# Patient Record
Sex: Female | Born: 1950 | Race: White | Hispanic: No | Marital: Married | Smoking: Former smoker
Health system: Southern US, Community
[De-identification: ages and names within clinical notes are randomized; demographics above are authoritative.]

## PROBLEM LIST (undated history)

## (undated) DIAGNOSIS — M1990 Unspecified osteoarthritis, unspecified site: Secondary | ICD-10-CM

## (undated) DIAGNOSIS — R011 Cardiac murmur, unspecified: Secondary | ICD-10-CM

## (undated) DIAGNOSIS — C801 Malignant (primary) neoplasm, unspecified: Secondary | ICD-10-CM

## (undated) DIAGNOSIS — Z8619 Personal history of other infectious and parasitic diseases: Secondary | ICD-10-CM

## (undated) DIAGNOSIS — T8859XA Other complications of anesthesia, initial encounter: Secondary | ICD-10-CM

## (undated) DIAGNOSIS — S93699A Other sprain of unspecified foot, initial encounter: Secondary | ICD-10-CM

## (undated) DIAGNOSIS — T4145XA Adverse effect of unspecified anesthetic, initial encounter: Secondary | ICD-10-CM

## (undated) DIAGNOSIS — R351 Nocturia: Secondary | ICD-10-CM

## (undated) DIAGNOSIS — I10 Essential (primary) hypertension: Secondary | ICD-10-CM

## (undated) HISTORY — PX: HAMMER TOE SURGERY: SHX385

## (undated) HISTORY — PX: OTHER SURGICAL HISTORY: SHX169

## (undated) HISTORY — PX: TUBAL LIGATION: SHX77

## (undated) HISTORY — PX: TIBIA FRACTURE SURGERY: SHX806

## (undated) HISTORY — PX: TONSILLECTOMY: SUR1361

## (undated) HISTORY — PX: KNEE ARTHROSCOPY: SHX127

---

## 2003-10-15 HISTORY — PX: JOINT REPLACEMENT: SHX530

## 2007-07-28 ENCOUNTER — Inpatient Hospital Stay (HOSPITAL_COMMUNITY): Admission: RE | Admit: 2007-07-28 | Discharge: 2007-07-31 | Payer: Self-pay | Admitting: Orthopedic Surgery

## 2007-08-31 ENCOUNTER — Encounter: Admission: RE | Admit: 2007-08-31 | Discharge: 2007-09-23 | Payer: Self-pay | Admitting: Orthopedic Surgery

## 2008-04-07 ENCOUNTER — Ambulatory Visit (HOSPITAL_COMMUNITY): Admission: RE | Admit: 2008-04-07 | Discharge: 2008-04-07 | Payer: Self-pay | Admitting: Family Medicine

## 2009-01-18 IMAGING — CR DG CHEST 2V
2 series · 2 of 2 positions shown · non-contrast
Comparison: None

CLINICAL DATA: Preop for left knee injury
CHEST - 2 VIEW

[w chest pa]
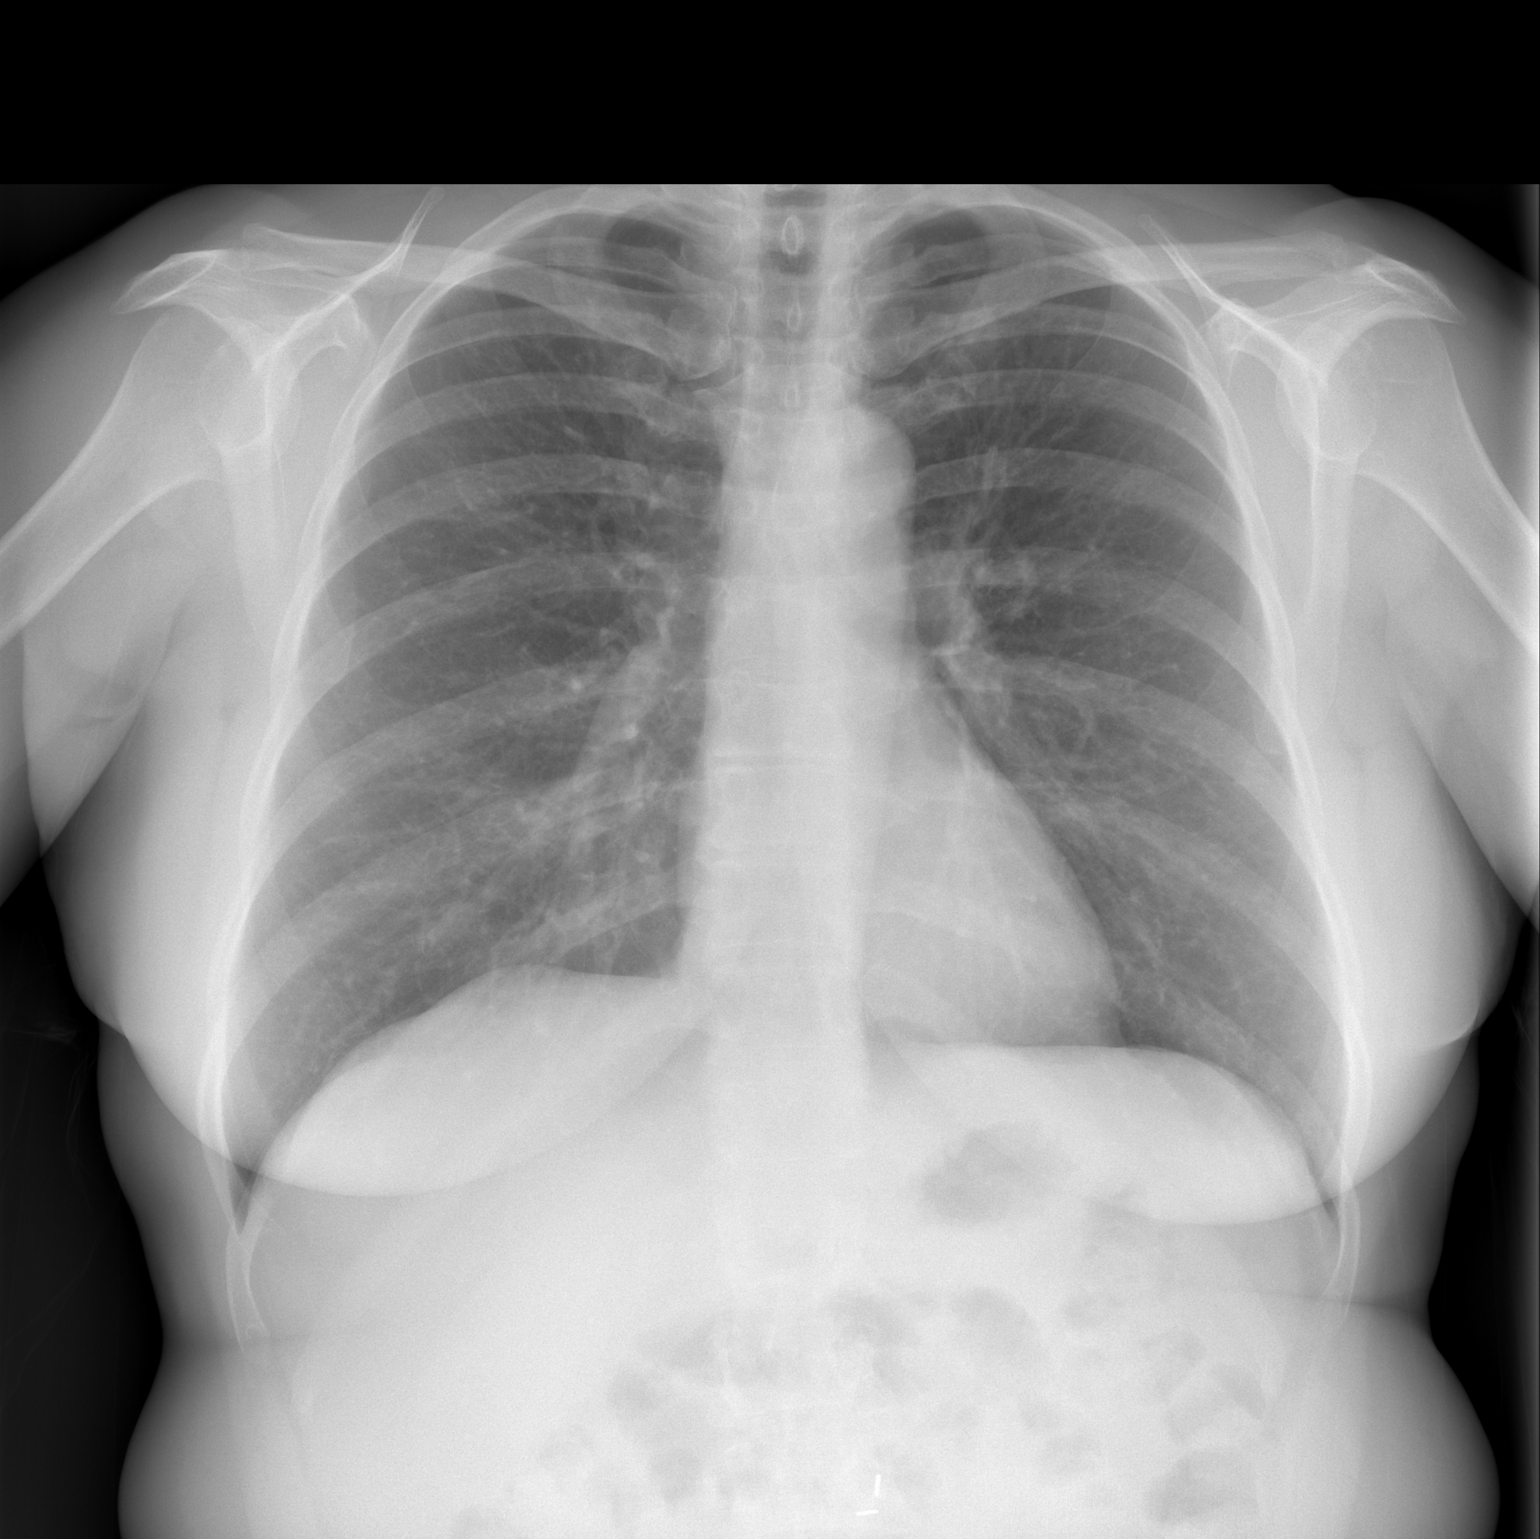

[w chest lat]
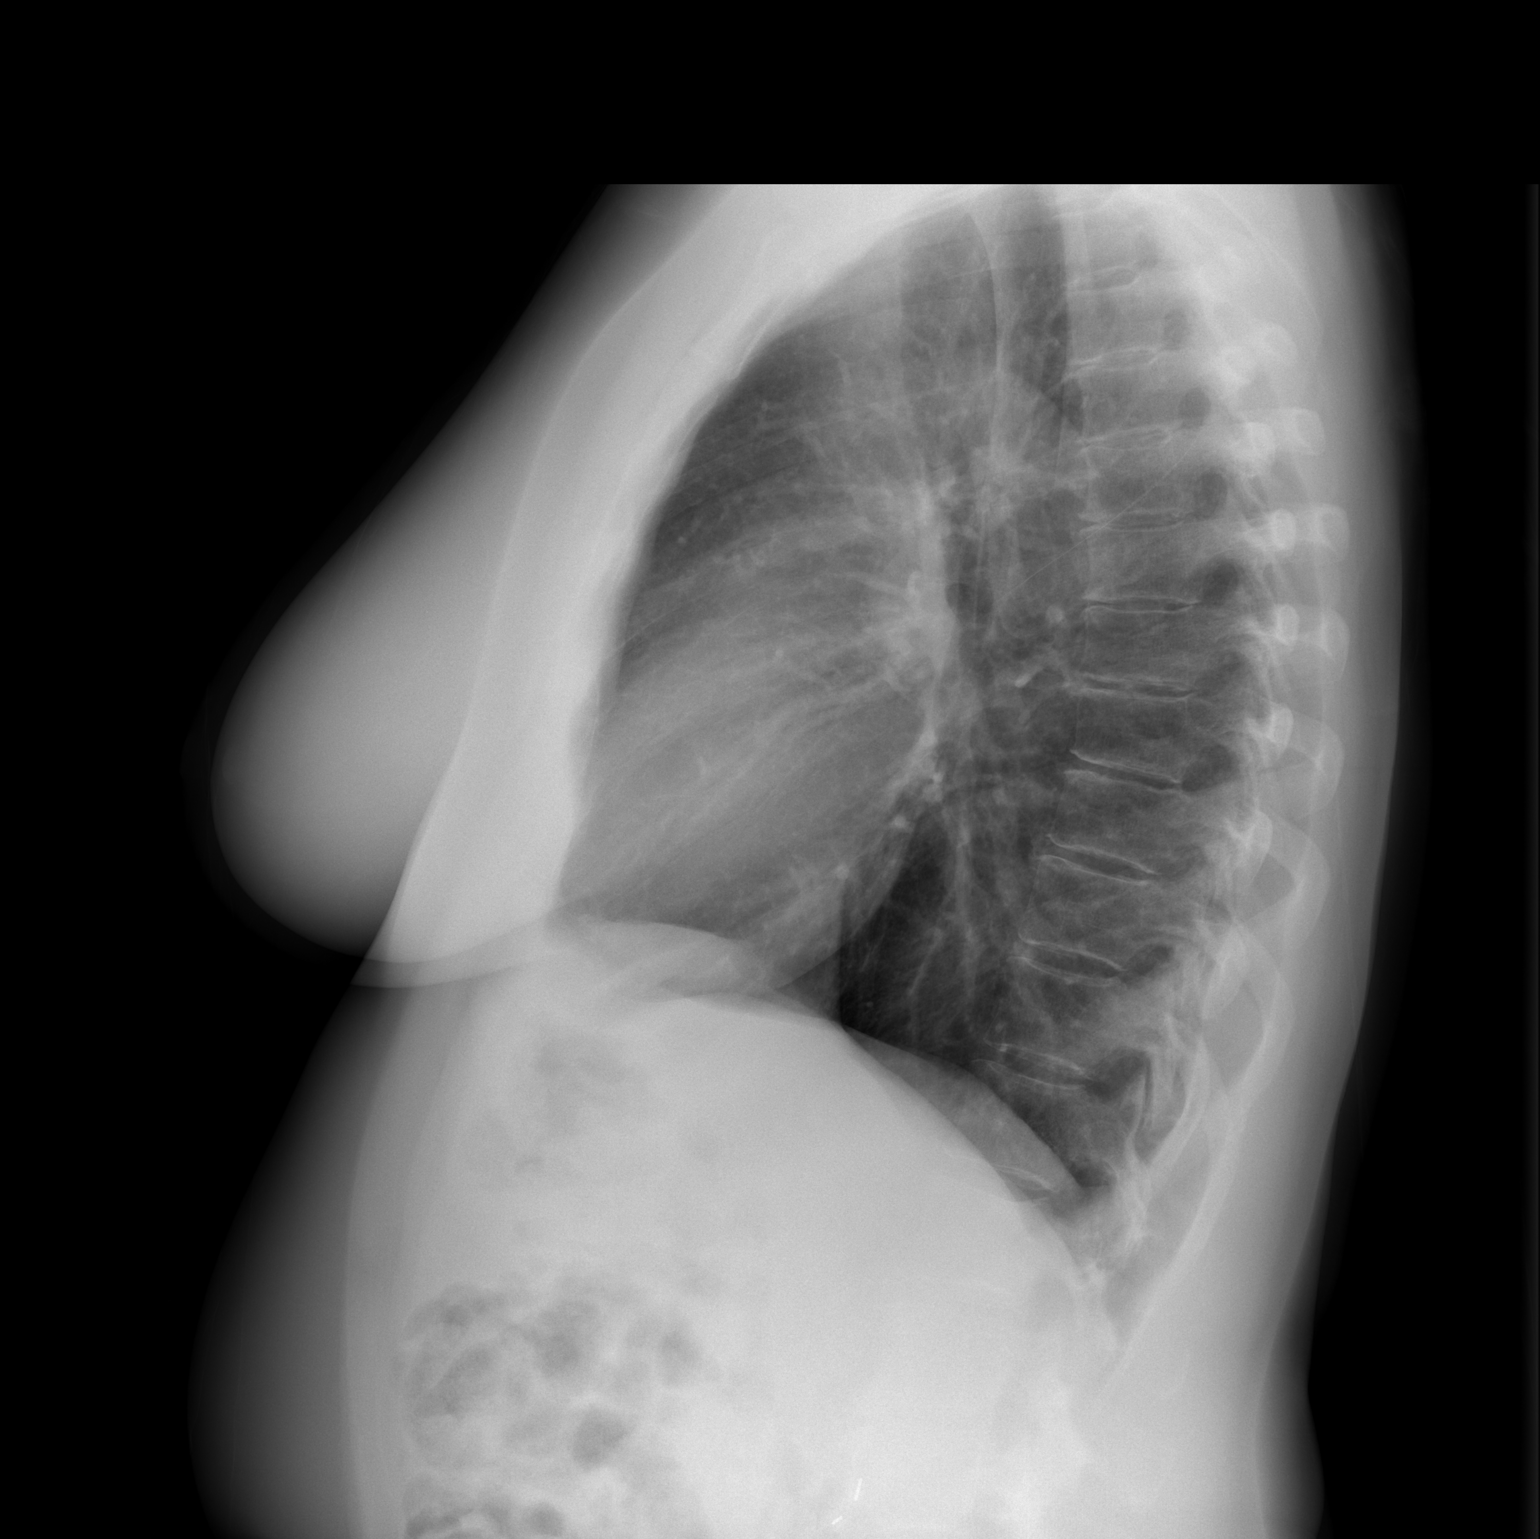

[2 of 2 positions shown; findings below may reference images not displayed]

FINDINGS: Midline trachea. Normal heart size and mediastinal contours. Sharp
costophrenic angles. No pneumothorax or congestive failure. Clear lungs. Mild
peribronchial thickening could relate to smoking or chronic bronchitis. Surgical
changes in the central abdomen.

IMPRESSION

No acute cardiopulmonary disease.

## 2009-04-10 ENCOUNTER — Ambulatory Visit (HOSPITAL_COMMUNITY): Admission: RE | Admit: 2009-04-10 | Discharge: 2009-04-10 | Payer: Self-pay | Admitting: Family Medicine

## 2011-02-26 NOTE — Discharge Summary (Signed)
[name], Reese NO.:  [id number]   MEDICAL RECORD NO.:  [id number]          PATIENT TYPE:  INP   LOCATION:  1612                         FACILITY:  [facility]   PHYSICIAN:  Madlyn Frankel. Charlann Boxer, M.D.  DATE OF BIRTH:  06-Jun-1951   DATE OF ADMISSION:  07/28/2007  DATE OF DISCHARGE:  07/31/2007                               DISCHARGE SUMMARY   ADMISSION DIAGNOSES:  1. Osteoarthritis.  2. Osteopenia.  3. History of de Quervain.  4. Trigger thumb.   DISCHARGE DIAGNOSES:  1. Osteoarthritis.  2. Osteopenia.  3. History of de Quervain.  4. Trigger thumb.   CONSULTATION:  None.   PROCEDURE:  Left total knee arthroplasty.  Surgeon:  Dr. Durene Romans.  Assistant:  Dwyane Luo.   HISTORY OF PRESENT ILLNESS:  A 60 year old female with a history of  tibial plateau fracture with post-traumatic osteoarthritis.  She was  refractory to all conservative treatment.  Was presurgical assessed by  her primary care physician, Dr. Joycelyn Rua, with evaluation by  Sherre Lain, FNP.   LABORATORY DATA:  Preadmission CBC:  Hematocrit 42.4; discharge  hematocrit stable at 29.5.  Routine chemistry within normal limits. At  discharge, all within normal limits.  Coags normal.  UA negative.   RADIOLOGY:  Chest two-view negative.   EKG:  Normal sinus rhythm.   HOSPITAL COURSE:  The patient underwent left total knee replacement and  tolerated procedure well. Admitted to orthopedic the floor.  She  remained afebrile throughout course of stay.  She remained  neurovascularly intact in left lower extremity throughout course of  stay.  She was able to do straight leg raise. On the time of discharge,  she was weightbearing as tolerated.  Dressing was changed on daily basis  postop days 2-4 with no significant drainage from wound.  Lovenox for  DVT prophylaxis started postop day #1 at 30 mg subcutaneously q. 12 h.  After 3 days she was stable, was able to walk at least 50 feet and was  ready for discharge with home health care and health aide worker due to  problematic a caregiver after surgery.   DISCHARGE DISPOSITION:  Stable and improved condition.   DISCHARGE PHYSICAL THERAPY:  She was weightbearing as tolerated with use  of rolling walker.   DISCHARGE DIET:  Regular.   DISCHARGE WOUND CARE:  Keep wound dry.   DISCHARGE MEDICATIONS:  1. Lovenox 30 mg subcutaneously q. 12 h x11 days.  2. Robaxin 500 mg p.o. q. 6 h.  3. Iron 325 mg p.o. 3 times a day x3 weeks.  4. Enteric-coated aspirin 325 mg p.o. daily x4 weeks after Lovenox      completed.  5. Colace 100 mg p.o. b.i.d.  6. MiraLax 17 grams p.o. daily.  7. Vicodin 5/325 one to two p.o. q. 4-6 h p.r.n. pain.  8. Actonel one weekly  9. Multivitamin.  10.Vitamin E.  11.Vitamin B.  12.Vitamin C 500 mg p.o. daily.  13.Fish oil 1000 mg x2 daily, hold for 1 week.  14.Calcium plus D daily.  15.Glucosamine chondroitin, hold.  16.Celebrex, hold.  DISCHARGE FOLLOWUP:  Follow up with Dr. Charlann Boxer in 2 weeks for wound care  check.     ______________________________  Yetta Glassman. [address], [place]      Madlyn Frankel. Charlann Boxer, M.D.  Electronically Signed    BLM/MEDQ  D:  08/18/2007  T:  08/18/2007  Job:  [id number]   cc:   Joycelyn Rua, M.D.  Fax: [phone]   Sherre Lain, NP

## 2011-02-26 NOTE — H&P (Signed)
[name], [name] NO.:  [id number]   MEDICAL RECORD NO.:  [id number]          PATIENT TYPE:  INP   LOCATION:  NA                           [facility]ACILITY:  [facility]   PHYSICIAN:  Madlyn Frankel. Charlann Boxer, M.D.  DATE OF BIRTH:  10/27/1950   DATE OF ADMISSION:  07/28/2007  DATE OF DISCHARGE:                              HISTORY & PHYSICAL   PROCEDURE:  Left total knee arthroplasty.   CHIEF COMPLAINT:  Left knee pain.   HISTORY OF PRESENT ILLNESS:  This is a 60 year old female with a history  of a tibial plateau fracture with post-traumatic osteoarthritis.  She  has been refractory to all conservative treatment.  She has been  presurgically assessed by her primary care physician, Dr. Joycelyn Rua, with evaluation by Carolynn Sayers, FNP.   PAST MEDICAL HISTORY:  Significant for:  1. Tibial plateau fracture with plate fixation and post-traumatic      osteoarthritis.  2. Osteopenia.  3. History of de Quervain.  4. Trigger thumb.   PAST SURGICAL HISTORY:  Includes:  1. Open reduction internal fixation tibial plateau fracture.  2. Bilateral carpal tunnel repair in the 1990s.   FAMILY HISTORY:  Heart disease, diabetes, cancer, stroke, arthritis,  lupus.   SOCIAL HISTORY:  The patient is married.  Primary caregiver after  surgery will be husband and friends.   DRUG ALLERGIES:  CODEINE upsets her stomach.   MEDICATIONS:  1. Darvocet-N 100 two tablets at bedtime p.r.n.  2. Multivitamin.  3. Vitamin E 500 units.  4. Vitamin B12 1000 mcg.  5. Vitamin B6 100 mg.  6. Vitamin C 500 mg.  7. Calcium plus D 600 mg.  8. Fish oil 1000 mg two daily, one a.m., one p.m.  9. Glucosamine chondroitin two daily, one a.m., one p.m.  10.Tylenol 500 mg two tablets p.r.n.   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  Otherwise, see HPI.   PHYSICAL EXAMINATION:  Pulse 72, respirations 18, blood pressure 140/88.  GENERAL:  Awake, alert and  oriented, well-developed, well-nourished, no  acute distress.  NECK:  Supple.  No carotid bruits.  CHEST:  Lungs clear to auscultation bilaterally.  BREASTS:  Deferred.  HEART:  Regular rate and rhythm without gallops, clicks, rubs or  murmurs.  ABDOMEN:  Soft, nontender, nondistended.  Bowel sounds present.  GENITOURINARY:  Deferred.  EXTREMITIES:  She has full extension.  She is mildly hyper with full  flexion 120 degrees plus  SKIN:  She does have an anterolateral scar from previous surgery.  No  signs of cellulitis.  No infection.  NEUROLOGIC:  Intact distal sensibilities.   LABORATORIES:  CBC are all within normal limits.  CMET within normal  limits.  EKG:  Normal sinus rhythm.  Chest x-ray pending.   IMPRESSION:  1. Post-traumatic osteoarthritis.  2. Osteopenia.   PLAN OF ACTION:  Left total knee arthroplasty with hardware removal by  surgeon Dr. Durene Romans.  Risks and complications were discussed.   Postoperative medications including Lovenox, Robaxin, iron, aspirin,  Colace, MiraLax provided  at time of history and physical.  Postoperative  pain medication will be given at time of surgery.     ______________________________  [name], [place]      Madlyn Frankel. Charlann Boxer, M.D.  Electronically Signed    BLM/MEDQ  D:  07/23/2007  T:  07/23/2007  Job:  [id number]

## 2011-02-26 NOTE — Op Note (Signed)
NAMEMARCI, [name] NO.:  [id number]   MEDICAL RECORD NO.:  [id number]          PATIENT TYPE:  INP   LOCATION:  0003                         FACILITY:  [facility]   PHYSICIAN:  Madlyn Frankel. Charlann Boxer, M.D.  DATE OF BIRTH:  [birth year]   DATE OF PROCEDURE:  07/28/2007  DATE OF DISCHARGE:                               OPERATIVE REPORT   PREOPERATIVE DIAGNOSIS:  Post traumatic left knee osteoarthritis  primarily in lateral compartment of the knee following open reduction  and internal fixation of left proximal tibia fracture.   POSTOPERATIVE DIAGNOSIS:  Post traumatic left knee osteoarthritis  primarily in lateral compartment of the knee following open reduction  and internal fixation of left proximal tibia fracture.   PROCEDURE:  1. Removal of left tibial hardware about the left knee.  2. Left total knee replacement.   COMPONENTS USED:  DePuy rotating platform posterior stabilized knee  system, size 3 femur, size 3 MBT revision tibial tray, given the  potential stress risers and the potential need for augmentation, insert  was size 10 and a 38 patellar button.   SURGEON:  Madlyn Frankel. Charlann Boxer, M.D.   ASSISTANT:  Yetta Glassman. Mann, P.A.-C.   ANESTHESIA:  Duramorph spinal.   DRAINS:  One.   TOURNIQUET TIME:  58 minutes at 350 mmHg.   COMPLICATIONS:  None.   INDICATIONS FOR PROCEDURE:  Ms. Jansma is a 60 year old female who  presented to the office for evaluation of her left knee.  She had had  persistent recurrent discomfort after open reduction and internal  fixation of a left tibial plateau fracture approximately two years  prior.  She had failed conservative measures of injections and, other  than some hesitation for a hospital stay and concerns she had from  previous hospitalization, she was definitely ready to have knee  replacement surgery due to discomfort and decreased quality of life.  She was unable to function at her normal level and wished to proceed.  The  risks of infection, DVT, component failure, need for revision,  extensive therapy required postop, were all discussed for the benefit of  pain relief.  Consent was obtained.   PROCEDURE IN DETAIL:  The patient was brought to the operative theater.  Once adequate anesthesia and preoperative antibiotics, Ancef, were  administered, the patient was positioned supine.  A proximal thigh  tourniquet was placed.  The left lower extremity was pre-scrubbed and  prepped and draped in a sterile fashion.  The leg was exsanguinated and  tourniquet elevated.  The patient had a more mid to lateral type  incision from her previous open reduction and internal fixation.  I  utilized this incision but extended it proximal to the patella to allow  for exposure of the knee.  The incision was made and skin flaps created.  The anterior lateral fascial compartment was opened with the Bovie  cautery and plate identified immediately.  I debrided the soft tissue,  muscle, and scar off of the plate.  I then removed seven 3.5 screws.  These screws were removed without difficulty, thus removing the plate  without issue.  It was taken off the field.  At this point, I irrigated  this wound and reapproximated the fascial wound with a #1 Vicryl.   Following this procedure, attention was now directed to the total knee  portion.  A median parapatellar arthrotomy was carried out with medial  patellar subluxation rather than eversion.  Initial debridement was  carried out.  Attention was first directed to the patella, the precut  measurement was 24 mm.  I resected down to 14 mm and used a 38 patellar  button.  Drill holes were made and the metal shim utilized to protect  the patella from retractors. Attention was now directed to the femur.  The femoral canal was opened with a drill and irrigated to prevent fat  emboli.  The intramedullary rod was then passed.  At 5 degrees of  valgus, I chose to remove 9 mm of bone off the  distal femur due to fact  that she had 10 degrees of hyperextension passively.  Following this  cut, I sized the femur to be a size 3.  The anterior, posterior, and  chamfer cuts were then made based on the posterior condylar reference  which was perpendicular to the [facility] line and very well visualized  epicondylar axis it was parallel to.  The final box cut was made based  off the lateral aspect of the distal femur.   At this point, attention was directed to the tibia where retractors were  placed.  The tibia was subluxed anteriorly and medial and lateral  meniscus removed.  The proximal tibial cut was based off the medial  side. I chose to resect 10 mm of bone off this side which allowed an  approximately 2 mm cut through the severely abnormal lateral proximal  tibia.  There was no normal cartilage there and it was severely eroded  away.  This cut was made and actually cut just beneath that lateral  defect making a fresh bone field.  There was noted to be a large cyst on  the bone and the central portion which was later reamed out preparing  for the DePuy revision tray.  At this point, I sized the cut surface to  be a size 3, pinned it in position, drilled it at 2 degrees anterior  slope.   Trial reduction was again carried out with the size 3 DePuy revision  tray, it was keeled in positioned along the line, checked and confirmed  the cut was perpendicular in the anterior plane and sloped enough  anteriorly for this component.  With the size 3 femoral component and  size 10 insert, the knee came out to full extension and was stable from  extension to flexion.  The patella tracked well without application of  pressure.  At this point, all trial components were removed.  I  irrigated the knee out with normal saline solution and prepared the bony  surfaces.  I injected the knee with 0.25% Marcaine with epinephrine and  1 mL of Toradol.  At this point, the final components were opened  and  then cemented into position.  Extruded and excess cement was removed.  Once the cement was removed and cured, excessive cement was debrided  from the posterior aspect of the knee.  When I was satisfied there was  no extra cement around, I irrigated the knee out and injected 5 mL of  FloSeal into the gutters medially and laterally.  I then placed the 10  mm insert.  A medium Hemovac drain was placed. The knee was brought up  to flexion with the tourniquet down and #1 Vicryl was used to  reapproximate the extensor mechanism.   The remainder of the wound was closed in layers with 2-0 Vicryl and  staples on the skin.  The knee was cleaned, dried and dressed sterilely  with Xeroform sterile dressing and a bulky wrap.  She was brought to the  recovery room in stable condition.      Madlyn Frankel Charlann Boxer, M.D.  Electronically Signed     MDO/MEDQ  D:  07/28/2007  T:  07/28/2007  Job:  [id number]

## 2011-07-24 LAB — CBC
HCT: 29.4 — ABNORMAL LOW
HCT: 29.5 — ABNORMAL LOW
Hemoglobin: 10.1 — ABNORMAL LOW
RBC: 3.27 — ABNORMAL LOW
RDW: 12.9
RDW: 13.9
WBC: 4.7

## 2011-07-24 LAB — BASIC METABOLIC PANEL
BUN: 7
CO2: 29
Calcium: 8.3 — ABNORMAL LOW
Chloride: 101
Creatinine, Ser: 0.76
GFR calc Af Amer: >60
Glucose, Bld: 95
Potassium: 4.1
Sodium: 134 — ABNORMAL LOW

## 2011-07-25 LAB — APTT: aPTT: 28

## 2011-07-25 LAB — URINALYSIS, ROUTINE W REFLEX MICROSCOPIC
Bilirubin Urine: NEGATIVE
Hgb urine dipstick: NEGATIVE
Ketones, ur: NEGATIVE
Nitrite: NEGATIVE
Protein, ur: NEGATIVE
Specific Gravity, Urine: 1.005

## 2011-07-25 LAB — COMPREHENSIVE METABOLIC PANEL
ALT: 62 — ABNORMAL HIGH
CO2: 30
Chloride: 100
Potassium: 4.3
Sodium: 138
Total Bilirubin: 0.6

## 2011-07-25 LAB — CBC
Hemoglobin: 14.3
MCHC: 35.3
Platelets: 305

## 2011-07-25 LAB — PROTIME-INR
INR: 1
Prothrombin Time: 13

## 2011-08-23 ENCOUNTER — Other Ambulatory Visit (HOSPITAL_BASED_OUTPATIENT_CLINIC_OR_DEPARTMENT_OTHER): Payer: Self-pay | Admitting: Family Medicine

## 2011-08-23 ENCOUNTER — Other Ambulatory Visit (HOSPITAL_COMMUNITY): Payer: Self-pay | Admitting: Family Medicine

## 2011-08-23 DIAGNOSIS — Z1231 Encounter for screening mammogram for malignant neoplasm of breast: Secondary | ICD-10-CM

## 2011-08-23 DIAGNOSIS — Z139 Encounter for screening, unspecified: Secondary | ICD-10-CM

## 2011-09-27 ENCOUNTER — Ambulatory Visit: Payer: Self-pay

## 2011-09-27 ENCOUNTER — Ambulatory Visit (HOSPITAL_COMMUNITY)
Admission: RE | Admit: 2011-09-27 | Discharge: 2011-09-27 | Disposition: A | Discharge status: Home / Self Care | Payer: 59 | Source: Ambulatory Visit | Attending: Family Medicine | Admitting: Family Medicine

## 2011-09-27 DIAGNOSIS — Z1231 Encounter for screening mammogram for malignant neoplasm of breast: Secondary | ICD-10-CM | POA: Insufficient documentation

## 2012-03-12 ENCOUNTER — Encounter (HOSPITAL_COMMUNITY): Payer: Self-pay | Admitting: Pharmacy Technician

## 2012-03-16 ENCOUNTER — Encounter (HOSPITAL_COMMUNITY): Payer: Self-pay

## 2012-03-16 ENCOUNTER — Encounter (HOSPITAL_COMMUNITY)
Admission: RE | Admit: 2012-03-16 | Discharge: 2012-03-16 | Disposition: A | Discharge status: Home / Self Care | Payer: 59 | Source: Ambulatory Visit | Attending: Orthopedic Surgery | Admitting: Orthopedic Surgery

## 2012-03-16 HISTORY — DX: Personal history of other infectious and parasitic diseases: Z86.19

## 2012-03-16 HISTORY — DX: Unspecified osteoarthritis, unspecified site: M19.90

## 2012-03-16 HISTORY — DX: Malignant (primary) neoplasm, unspecified: C80.1

## 2012-03-16 HISTORY — DX: Nocturia: R35.1

## 2012-03-16 HISTORY — DX: Other complications of anesthesia, initial encounter: T88.59XA

## 2012-03-16 HISTORY — DX: Other sprain of unspecified foot, initial encounter: S93.699A

## 2012-03-16 HISTORY — DX: Adverse effect of unspecified anesthetic, initial encounter: T41.45XA

## 2012-03-16 LAB — URINALYSIS, ROUTINE W REFLEX MICROSCOPIC
Bilirubin Urine: NEGATIVE
Glucose, UA: NEGATIVE mg/dL
Hgb urine dipstick: NEGATIVE
Ketones, ur: NEGATIVE mg/dL
Specific Gravity, Urine: 1.012 (ref 1.005–1.030)
pH: 6.5 (ref 5.0–8.0)

## 2012-03-16 LAB — CBC
Hemoglobin: 14.1 g/dL (ref 12.0–15.0)
MCH: 31.1 pg (ref 26.0–34.0)
MCHC: 34.6 g/dL (ref 30.0–36.0)
MCV: 90.1 fL (ref 78.0–100.0)
RBC: 4.53 MIL/uL (ref 3.87–5.11)

## 2012-03-16 LAB — BASIC METABOLIC PANEL
BUN: 16 mg/dL (ref 6–23)
CO2: 27 mEq/L (ref 19–32)
Calcium: 9.6 mg/dL (ref 8.4–10.5)
Chloride: 99 mEq/L (ref 96–112)
Creatinine, Ser: 0.68 mg/dL (ref 0.50–1.10)
GFR calc Af Amer: >90 mL/min (ref >90)
GFR calc non Af Amer: >90 mL/min (ref >90)
Glucose, Bld: 94 mg/dL (ref 70–99)
Potassium: 4.6 mEq/L (ref 3.5–5.1)
Sodium: 135 mEq/L (ref 135–145)

## 2012-03-16 LAB — SURGICAL PCR SCREEN
MRSA, PCR: NEGATIVE
Staphylococcus aureus: NEGATIVE

## 2012-03-16 LAB — DIFFERENTIAL
Basophils Relative: 0 % (ref 0–1)
Eosinophils Absolute: 0.1 10*3/uL (ref 0.0–0.7)
Eosinophils Relative: 1 % (ref 0–5)
Lymphs Abs: 2 10*3/uL (ref 0.7–4.0)
Monocytes Relative: 7 % (ref 3–12)

## 2012-03-16 LAB — APTT: aPTT: 32 seconds (ref 24–37)

## 2012-03-16 MED ORDER — CHLORHEXIDINE GLUCONATE 4 % EX LIQD
60.0000 mL | Freq: Once | CUTANEOUS | Status: DC
  Filled 2012-03-16: qty 60

## 2012-03-16 NOTE — Patient Instructions (Signed)
20 [name]  03/16/2012   Your procedure is scheduled on:  03/23/12  Report to SHORT STAY DEPT  at 5:15 AM.  Call this number if you have problems the morning of surgery: [phone]   Remember:   Do not eat food or drink liquids AFTER MIDNIGHT    Take these medicines the morning of surgery with A SIP OF WATER: NONE   Do not wear jewelry, make-up or nail polish.  Do not wear lotions, powders, or perfumes.   Do not shave legs or underarms 48 hrs. before surgery (men may shave face)  Do not bring valuables to the hospital.  Contacts, dentures or bridgework may not be worn into surgery.  Leave suitcase in the car. After surgery it may be brought to your room.  For patients admitted to the hospital, checkout time is 11:00 AM the day of discharge.   Patients discharged the day of surgery will not be allowed to drive home.    Special Instructions:   Please read over the following fact sheets that you were given: MRSA  Information / Incentive Spirometer               SHOWER WITH BETASEPT THE NIGHT BEFORE SURGERY AND THE MORNING OF SURGERY

## 2012-03-20 NOTE — H&P (Signed)
[name] is an 61 y.o. female.    Chief Complaint:  Left knee pain, S/P left total knee arthroplasty  HPI: Pt is a 61 y.o. female complaining of left knee pain for a couple of month.  Also having symptoms of clicking, popping, locking and giving out. Pain had continually increased since the beginning. X-rays in the clinic show the components of the left total knee arthroplasty to be in good position. Various options are discussed with the patient. Risks, benefits and expectations were discussed with the patient. Patient understand the risks, benefits and expectations and wishes to proceed with surgery.   PCP:  No primary provider on file.  D/C Plans:  Home with HHPT  Post-op Meds:   Rx given for ASA, Robaxin, Iron, Colace and MiraLax  Tranexamic Acid:   Not to be given  Decadron:   To be given  PMH: Past Medical History  Diagnosis Date  . Complication of anesthesia     ITCHING  . Arthritis   . History of shingles     X2  . Nocturia   . Cancer     Hx skin cancer  . Plantar fascia rupture     PSH: Past Surgical History  Procedure Date  . Joint replacement 2005    Lt  ttotal knee  . Carpal tunnell     BIL  . Thumb surg   . Trigger thumb    . Tibia fracture surgery     WITH PLATE  . Knee arthroscopy     LEFT  . Tonsillectomy   . Tubal ligation     Social History:  reports that she quit smoking about 7 years ago. She does not have any smokeless tobacco history on file. She reports that she drinks alcohol. She reports that she does not use illicit drugs.  Allergies:  Allergies  Allergen Reactions  . Codeine     Reaction=vertigo    Medications: No current facility-administered medications for this encounter.   Current Outpatient Prescriptions  Medication Sig Dispense Refill  . acetaminophen (TYLENOL) 500 MG tablet Take 1,000 mg by mouth every 6 (six) hours as needed. For pain      . amoxicillin (AMOXIL) 500 MG capsule Take 500 mg by mouth See admin  instructions. Take 4 capsules by mouth before dental procedures      . Calcium Carbonate-Vitamin D (CALCIUM + D PO) Take 1 tablet by mouth daily.      . fish oil-omega-3 fatty acids 1000 MG capsule Take 1,200 mg by mouth daily.      Marland Kitchen ibuprofen (ADVIL,MOTRIN) 200 MG tablet Take 400 mg by mouth every 6 (six) hours as needed. For pain      . Multiple Vitamin (MULITIVITAMIN WITH MINERALS) TABS Take 1 tablet by mouth daily.      . vitamin B-12 (CYANOCOBALAMIN) 100 MCG tablet Take 100 mcg by mouth daily.      . vitamin C (ASCORBIC ACID) 500 MG tablet Take 500 mg by mouth daily.      . vitamin E 400 UNIT capsule Take 400 Units by mouth daily.        ROS: Review of Systems  Constitutional: Negative.   HENT: Negative.   Eyes: Negative.   Respiratory: Negative.   Cardiovascular: Negative.   Gastrointestinal: Negative.   Genitourinary: Negative.   Musculoskeletal: Positive for joint pain.  Skin: Negative.   Neurological: Negative.   Endo/Heme/Allergies: Negative.   Psychiatric/Behavioral: Negative.      Physical Exam: BP: 143/94 ;  HR: 83 ; Resp: 18; Physical Exam  Constitutional: She is oriented to person, place, and time and well-developed, well-nourished, and in no distress.  HENT:  Head: Normocephalic and atraumatic.  Nose: Nose normal.  Mouth/Throat: Oropharynx is clear and moist.  Eyes: Pupils are equal, round, and reactive to light.  Neck: Neck supple. No JVD present. No tracheal deviation present. No thyromegaly present.  Cardiovascular: Normal rate, regular rhythm, normal heart sounds and intact distal pulses.   Pulmonary/Chest: Effort normal and breath sounds normal.  Abdominal: There is no tenderness. There is no guarding.  Musculoskeletal:       Left knee: She exhibits decreased range of motion, swelling and laceration (well healed from previous surgery). She exhibits no effusion, no ecchymosis and no deformity. tenderness found.  Lymphadenopathy:    She has no cervical  adenopathy.  Neurological: She is alert and oriented to person, place, and time.  Skin: Skin is warm and dry.  Psychiatric: Affect normal.      Assessment/Plan Assessment:  Left knee pain, S/P left total knee arthroplasty   Plan: Patient will undergo a left total knee revision with scar debridement and poly exchange on 03/23/2012 per Dr. Charlann Boxer at [facility]. Risks benefits and expectation were discussed with the patient. Patient understand risks, benefits and expectation and wishes to proceed.   Anastasio Auerbach Farryn Linares   PAC  03/20/2012, 12:51 PM

## 2012-03-23 ENCOUNTER — Encounter (HOSPITAL_COMMUNITY): Payer: Self-pay | Admitting: Anesthesiology

## 2012-03-23 ENCOUNTER — Observation Stay (HOSPITAL_COMMUNITY)
Admission: RE | Admit: 2012-03-23 | Discharge: 2012-03-25 | Disposition: A | Discharge status: Home / Self Care | Payer: 59 | Source: Ambulatory Visit | Attending: Orthopedic Surgery | Admitting: Orthopedic Surgery

## 2012-03-23 ENCOUNTER — Encounter (HOSPITAL_COMMUNITY): Payer: Self-pay | Admitting: *Deleted

## 2012-03-23 ENCOUNTER — Encounter (HOSPITAL_COMMUNITY)
Admission: RE | Disposition: A | Discharge status: Home / Self Care | Payer: Self-pay | Source: Ambulatory Visit | Attending: Orthopedic Surgery

## 2012-03-23 ENCOUNTER — Ambulatory Visit (HOSPITAL_COMMUNITY): Payer: 59 | Admitting: Anesthesiology

## 2012-03-23 DIAGNOSIS — T84029A Dislocation of unspecified internal joint prosthesis, initial encounter: Principal | ICD-10-CM | POA: Insufficient documentation

## 2012-03-23 DIAGNOSIS — Z96659 Presence of unspecified artificial knee joint: Secondary | ICD-10-CM

## 2012-03-23 DIAGNOSIS — Z01812 Encounter for preprocedural laboratory examination: Secondary | ICD-10-CM | POA: Insufficient documentation

## 2012-03-23 DIAGNOSIS — Y831 Surgical operation with implant of artificial internal device as the cause of abnormal reaction of the patient, or of later complication, without mention of misadventure at the time of the procedure: Secondary | ICD-10-CM | POA: Insufficient documentation

## 2012-03-23 DIAGNOSIS — M23302 Other meniscus derangements, unspecified lateral meniscus, unspecified knee: Secondary | ICD-10-CM | POA: Insufficient documentation

## 2012-03-23 DIAGNOSIS — R29898 Other symptoms and signs involving the musculoskeletal system: Secondary | ICD-10-CM | POA: Insufficient documentation

## 2012-03-23 DIAGNOSIS — Z79899 Other long term (current) drug therapy: Secondary | ICD-10-CM | POA: Insufficient documentation

## 2012-03-23 DIAGNOSIS — M25469 Effusion, unspecified knee: Secondary | ICD-10-CM | POA: Insufficient documentation

## 2012-03-23 DIAGNOSIS — Z85828 Personal history of other malignant neoplasm of skin: Secondary | ICD-10-CM | POA: Insufficient documentation

## 2012-03-23 HISTORY — PX: I&D KNEE WITH POLY EXCHANGE: SHX5024

## 2012-03-23 LAB — TYPE AND SCREEN: Antibody Screen: NEGATIVE

## 2012-03-23 SURGERY — IRRIGATION AND DEBRIDEMENT KNEE WITH POLY EXCHANGE
Anesthesia: Spinal | Site: Knee | Laterality: Left | Wound class: Clean

## 2012-03-23 MED ORDER — BUPIVACAINE IN DEXTROSE 0.75-8.25 % IT SOLN
INTRATHECAL | Status: DC | PRN
  Administered 2012-03-23: 1.8 mL via INTRATHECAL

## 2012-03-23 MED ORDER — ALUM & MAG HYDROXIDE-SIMETH 200-200-20 MG/5ML PO SUSP
30.0000 mL | ORAL | Status: DC | PRN
  Administered 2012-03-24 (×2): 30 mL via ORAL
  Filled 2012-03-23 (×2): qty 30

## 2012-03-23 MED ORDER — KETAMINE HCL 10 MG/ML IJ SOLN
INTRAMUSCULAR | Status: DC | PRN
  Administered 2012-03-23 (×10): 5 mg via INTRAVENOUS
  Administered 2012-03-23: 20 mg via INTRAVENOUS
  Administered 2012-03-23: 5 mg via INTRAVENOUS

## 2012-03-23 MED ORDER — BISACODYL 5 MG PO TBEC: 5.0000 mg | DELAYED_RELEASE_TABLET | Freq: Every day | ORAL | Status: DC | PRN

## 2012-03-23 MED ORDER — PROPOFOL 10 MG/ML IV EMUL
INTRAVENOUS | Status: DC | PRN
  Administered 2012-03-23: 200 ug/kg/min via INTRAVENOUS

## 2012-03-23 MED ORDER — ONDANSETRON HCL 4 MG PO TABS: 4.0000 mg | ORAL_TABLET | Freq: Four times a day (QID) | ORAL | Status: DC | PRN

## 2012-03-23 MED ORDER — ACETAMINOPHEN 10 MG/ML IV SOLN
INTRAVENOUS | Status: DC | PRN
  Administered 2012-03-23: 1000 mg via INTRAVENOUS

## 2012-03-23 MED ORDER — FLEET ENEMA 7-19 GM/118ML RE ENEM: 1.0000 | ENEMA | Freq: Once | RECTAL | Status: AC | PRN

## 2012-03-23 MED ORDER — HYDROMORPHONE HCL PF 1 MG/ML IJ SOLN: 0.2500 mg | INTRAMUSCULAR | Status: DC | PRN

## 2012-03-23 MED ORDER — ZOLPIDEM TARTRATE 5 MG PO TABS
5.0000 mg | ORAL_TABLET | Freq: Every evening | ORAL | Status: DC | PRN
  Administered 2012-03-23: 5 mg via ORAL
  Filled 2012-03-23: qty 1

## 2012-03-23 MED ORDER — PHENOL 1.4 % MT LIQD: 1.0000 | OROMUCOSAL | Status: DC | PRN

## 2012-03-23 MED ORDER — KETOROLAC TROMETHAMINE 30 MG/ML IJ SOLN
INTRAMUSCULAR | Status: DC | PRN
  Administered 2012-03-23: 30 mg

## 2012-03-23 MED ORDER — CEFAZOLIN SODIUM 1-5 GM-% IV SOLN
1.0000 g | Freq: Four times a day (QID) | INTRAVENOUS | Status: AC
  Administered 2012-03-23 (×2): 1 g via INTRAVENOUS
  Filled 2012-03-23 (×2): qty 50

## 2012-03-23 MED ORDER — CEFAZOLIN SODIUM 1-5 GM-% IV SOLN
1.0000 g | INTRAVENOUS | Status: AC
  Administered 2012-03-23: 2 g via INTRAVENOUS

## 2012-03-23 MED ORDER — SODIUM CHLORIDE 0.9 % IV SOLN
INTRAVENOUS | Status: DC
  Administered 2012-03-23 – 2012-03-24 (×2): via INTRAVENOUS
  Filled 2012-03-23 (×9): qty 1000

## 2012-03-23 MED ORDER — METOCLOPRAMIDE HCL 10 MG PO TABS: 5.0000 mg | ORAL_TABLET | Freq: Three times a day (TID) | ORAL | Status: DC | PRN

## 2012-03-23 MED ORDER — CEFAZOLIN SODIUM-DEXTROSE 2-3 GM-% IV SOLR
INTRAVENOUS | Status: AC
  Filled 2012-03-23: qty 50

## 2012-03-23 MED ORDER — HYDROCODONE-ACETAMINOPHEN 7.5-325 MG PO TABS
1.0000 | ORAL_TABLET | ORAL | Status: DC
  Administered 2012-03-23: 1 via ORAL
  Administered 2012-03-23 – 2012-03-24 (×3): 2 via ORAL
  Administered 2012-03-24: 1 via ORAL
  Filled 2012-03-23: qty 1
  Filled 2012-03-23 (×4): qty 2

## 2012-03-23 MED ORDER — ACETAMINOPHEN 10 MG/ML IV SOLN
INTRAVENOUS | Status: AC
  Filled 2012-03-23: qty 100

## 2012-03-23 MED ORDER — GLYCOPYRROLATE 0.2 MG/ML IJ SOLN
INTRAMUSCULAR | Status: DC | PRN
  Administered 2012-03-23 (×3): .05 mg via INTRAVENOUS

## 2012-03-23 MED ORDER — BUPIVACAINE-EPINEPHRINE 0.25% -1:200000 IJ SOLN
INTRAMUSCULAR | Status: AC
  Filled 2012-03-23: qty 1

## 2012-03-23 MED ORDER — FERROUS SULFATE 325 (65 FE) MG PO TABS
325.0000 mg | ORAL_TABLET | Freq: Three times a day (TID) | ORAL | Status: DC
  Administered 2012-03-23 – 2012-03-25 (×4): 325 mg via ORAL
  Filled 2012-03-23 (×10): qty 1

## 2012-03-23 MED ORDER — DOCUSATE SODIUM 100 MG PO CAPS
100.0000 mg | ORAL_CAPSULE | Freq: Two times a day (BID) | ORAL | Status: DC
  Administered 2012-03-23 – 2012-03-25 (×4): 100 mg via ORAL

## 2012-03-23 MED ORDER — PHENYLEPHRINE HCL 10 MG/ML IJ SOLN
10.0000 mg | INTRAVENOUS | Status: DC | PRN
  Administered 2012-03-23: 20 ug/min via INTRAVENOUS

## 2012-03-23 MED ORDER — TRANEXAMIC ACID 100 MG/ML IV SOLN
1180.0000 mg | Freq: Once | INTRAVENOUS | Status: DC
  Filled 2012-03-23: qty 11.8

## 2012-03-23 MED ORDER — METHOCARBAMOL 100 MG/ML IJ SOLN
500.0000 mg | Freq: Four times a day (QID) | INTRAVENOUS | Status: DC | PRN
  Administered 2012-03-23: 500 mg via INTRAVENOUS
  Filled 2012-03-23: qty 5

## 2012-03-23 MED ORDER — POLYETHYLENE GLYCOL 3350 17 G PO PACK
17.0000 g | PACK | Freq: Two times a day (BID) | ORAL | Status: DC
  Administered 2012-03-24 – 2012-03-25 (×3): 17 g via ORAL

## 2012-03-23 MED ORDER — MEPERIDINE HCL 50 MG/ML IJ SOLN: 6.2500 mg | INTRAMUSCULAR | Status: DC | PRN

## 2012-03-23 MED ORDER — LACTATED RINGERS IV SOLN
INTRAVENOUS | Status: DC | PRN
  Administered 2012-03-23 (×3): via INTRAVENOUS

## 2012-03-23 MED ORDER — LIDOCAINE HCL (CARDIAC) 20 MG/ML IV SOLN
INTRAVENOUS | Status: DC | PRN
  Administered 2012-03-23: 50 mg via INTRAVENOUS

## 2012-03-23 MED ORDER — PROMETHAZINE HCL 25 MG/ML IJ SOLN: 6.2500 mg | INTRAMUSCULAR | Status: DC | PRN

## 2012-03-23 MED ORDER — ONDANSETRON HCL 4 MG/2ML IJ SOLN
INTRAMUSCULAR | Status: DC | PRN
  Administered 2012-03-23: 4 mg via INTRAVENOUS

## 2012-03-23 MED ORDER — MIDAZOLAM HCL 5 MG/5ML IJ SOLN
INTRAMUSCULAR | Status: DC | PRN
  Administered 2012-03-23 (×4): 1 mg via INTRAVENOUS

## 2012-03-23 MED ORDER — MENTHOL 3 MG MT LOZG: 1.0000 | LOZENGE | OROMUCOSAL | Status: DC | PRN

## 2012-03-23 MED ORDER — HYDROMORPHONE HCL PF 1 MG/ML IJ SOLN
0.5000 mg | INTRAMUSCULAR | Status: DC | PRN
  Administered 2012-03-23: 1 mg via INTRAVENOUS
  Administered 2012-03-23: 0.5 mg via INTRAVENOUS
  Administered 2012-03-23 – 2012-03-24 (×2): 1 mg via INTRAVENOUS
  Administered 2012-03-24: 0.5 mg via INTRAVENOUS
  Administered 2012-03-24: 1 mg via INTRAVENOUS
  Filled 2012-03-23 (×6): qty 1

## 2012-03-23 MED ORDER — LACTATED RINGERS IV SOLN: INTRAVENOUS | Status: DC

## 2012-03-23 MED ORDER — METHOCARBAMOL 500 MG PO TABS
500.0000 mg | ORAL_TABLET | Freq: Four times a day (QID) | ORAL | Status: DC | PRN
  Administered 2012-03-23 – 2012-03-25 (×6): 500 mg via ORAL
  Filled 2012-03-23 (×6): qty 1

## 2012-03-23 MED ORDER — FENTANYL CITRATE 0.05 MG/ML IJ SOLN
INTRAMUSCULAR | Status: DC | PRN
  Administered 2012-03-23 (×2): 50 ug via INTRAVENOUS
  Administered 2012-03-23: 75 ug via INTRAVENOUS
  Administered 2012-03-23: 50 ug via INTRAVENOUS
  Administered 2012-03-23: 25 ug via INTRAVENOUS
  Administered 2012-03-23: 50 ug via INTRAVENOUS

## 2012-03-23 MED ORDER — ONDANSETRON HCL 4 MG/2ML IJ SOLN
4.0000 mg | Freq: Four times a day (QID) | INTRAMUSCULAR | Status: DC | PRN
  Administered 2012-03-24: 4 mg via INTRAVENOUS
  Filled 2012-03-23: qty 2

## 2012-03-23 MED ORDER — KETOROLAC TROMETHAMINE 30 MG/ML IJ SOLN
INTRAMUSCULAR | Status: AC
  Filled 2012-03-23: qty 1

## 2012-03-23 MED ORDER — METOCLOPRAMIDE HCL 5 MG/ML IJ SOLN: 5.0000 mg | Freq: Three times a day (TID) | INTRAMUSCULAR | Status: DC | PRN

## 2012-03-23 MED ORDER — PHENYLEPHRINE HCL 10 MG/ML IJ SOLN
INTRAMUSCULAR | Status: DC | PRN
  Administered 2012-03-23 (×2): 80 ug via INTRAVENOUS
  Administered 2012-03-23: 40 ug via INTRAVENOUS
  Administered 2012-03-23: 80 ug via INTRAVENOUS

## 2012-03-23 MED ORDER — DEXAMETHASONE SODIUM PHOSPHATE 10 MG/ML IJ SOLN
INTRAMUSCULAR | Status: DC | PRN
  Administered 2012-03-23: 10 mg via INTRAVENOUS

## 2012-03-23 MED ORDER — BUPIVACAINE-EPINEPHRINE PF 0.25-1:200000 % IJ SOLN
INTRAMUSCULAR | Status: DC | PRN
  Administered 2012-03-23: 50 mL

## 2012-03-23 MED ORDER — RIVAROXABAN 10 MG PO TABS
10.0000 mg | ORAL_TABLET | ORAL | Status: DC
  Administered 2012-03-24 – 2012-03-25 (×2): 10 mg via ORAL
  Filled 2012-03-23 (×3): qty 1

## 2012-03-23 MED ORDER — SODIUM CHLORIDE 0.9 % IR SOLN
Status: DC | PRN
  Administered 2012-03-23: 1000 mL

## 2012-03-23 MED ORDER — TRANEXAMIC ACID 100 MG/ML IV SOLN
1180.0000 mg | INTRAVENOUS | Status: DC | PRN
  Administered 2012-03-23: 1180 mg via INTRAVENOUS

## 2012-03-23 MED ORDER — DEXAMETHASONE SODIUM PHOSPHATE 10 MG/ML IJ SOLN
10.0000 mg | Freq: Once | INTRAMUSCULAR | Status: DC
  Filled 2012-03-23: qty 1

## 2012-03-23 MED ORDER — CELECOXIB 200 MG PO CAPS
200.0000 mg | ORAL_CAPSULE | Freq: Two times a day (BID) | ORAL | Status: DC
  Administered 2012-03-23 – 2012-03-25 (×5): 200 mg via ORAL
  Filled 2012-03-23 (×6): qty 1

## 2012-03-23 SURGICAL SUPPLY — 56 items
BAG ZIPLOCK 12X15 (MISCELLANEOUS) ×3 IMPLANT
BANDAGE ELASTIC 6 VELCRO ST LF (GAUZE/BANDAGES/DRESSINGS) ×3 IMPLANT
BANDAGE ESMARK 6X9 LF (GAUZE/BANDAGES/DRESSINGS) ×2 IMPLANT
BLADE SAW SGTL 13.0X1.19X90.0M (BLADE) ×3 IMPLANT
BLADE SAW SGTL 81X20 HD (BLADE) IMPLANT
BNDG ESMARK 6X9 LF (GAUZE/BANDAGES/DRESSINGS) ×3
BOWL SMART MIX CTS (DISPOSABLE) IMPLANT
CLOTH BEACON ORANGE TIMEOUT ST (SAFETY) ×3 IMPLANT
CUFF TOURN SGL QUICK 34 (TOURNIQUET CUFF) ×1
CUFF TRNQT CYL 34X4X40X1 (TOURNIQUET CUFF) ×2 IMPLANT
DRAPE EXTREMITY T 121X128X90 (DRAPE) ×3 IMPLANT
DRAPE POUCH INSTRU U-SHP 10X18 (DRAPES) ×3 IMPLANT
DRAPE U-SHAPE 47X51 STRL (DRAPES) ×3 IMPLANT
DRSG AQUACEL AG ADV 3.5X10 (GAUZE/BANDAGES/DRESSINGS) ×3 IMPLANT
DRSG PAD ABDOMINAL 8X10 ST (GAUZE/BANDAGES/DRESSINGS) ×3 IMPLANT
DURAPREP 26ML APPLICATOR (WOUND CARE) ×3 IMPLANT
ELECT REM PT RETURN 9FT ADLT (ELECTROSURGICAL) ×3
ELECTRODE REM PT RTRN 9FT ADLT (ELECTROSURGICAL) ×2 IMPLANT
EVACUATOR 1/8 PVC DRAIN (DRAIN) ×3 IMPLANT
FACESHIELD LNG OPTICON STERILE (SAFETY) ×15 IMPLANT
GAUZE XEROFORM 5X9 LF (GAUZE/BANDAGES/DRESSINGS) ×3 IMPLANT
GLOVE BIOGEL PI IND STRL 7.5 (GLOVE) ×2 IMPLANT
GLOVE BIOGEL PI IND STRL 8 (GLOVE) ×2 IMPLANT
GLOVE BIOGEL PI INDICATOR 7.5 (GLOVE) ×1
GLOVE BIOGEL PI INDICATOR 8 (GLOVE) ×1
GLOVE ORTHO TXT STRL SZ7.5 (GLOVE) ×6 IMPLANT
GOWN BRE IMP PREV XXLGXLNG (GOWN DISPOSABLE) ×3 IMPLANT
GOWN STRL NON-REIN LRG LVL3 (GOWN DISPOSABLE) ×3 IMPLANT
HANDPIECE INTERPULSE COAX TIP (DISPOSABLE) ×1
IMMOBILIZER KNEE 20 (SOFTGOODS) ×3
IMMOBILIZER KNEE 20 THIGH 36 (SOFTGOODS) ×2 IMPLANT
INSERT PFC SIG STB SZ3 15.0MM (Knees) ×3 IMPLANT
KIT BASIN OR (CUSTOM PROCEDURE TRAY) ×3 IMPLANT
MANIFOLD NEPTUNE II (INSTRUMENTS) ×3 IMPLANT
NDL SAFETY ECLIPSE 18X1.5 (NEEDLE) ×2 IMPLANT
NEEDLE HYPO 18GX1.5 SHARP (NEEDLE) ×1
NS IRRIG 1000ML POUR BTL (IV SOLUTION) ×3 IMPLANT
PACK TOTAL JOINT (CUSTOM PROCEDURE TRAY) ×3 IMPLANT
PADDING CAST COTTON 6X4 STRL (CAST SUPPLIES) ×3 IMPLANT
POSITIONER SURGICAL ARM (MISCELLANEOUS) ×3 IMPLANT
SET HNDPC FAN SPRY TIP SCT (DISPOSABLE) ×2 IMPLANT
SET PAD KNEE POSITIONER (MISCELLANEOUS) ×3 IMPLANT
SPONGE GAUZE 4X4 12PLY (GAUZE/BANDAGES/DRESSINGS) ×3 IMPLANT
SPONGE LAP 18X18 X RAY DECT (DISPOSABLE) IMPLANT
STAPLER VISISTAT (STAPLE) ×6 IMPLANT
SUCTION FRAZIER 12FR DISP (SUCTIONS) ×3 IMPLANT
SUT VIC AB 1 CT1 36 (SUTURE) ×3 IMPLANT
SUT VIC AB 2-0 CT1 27 (SUTURE) ×3
SUT VIC AB 2-0 CT1 TAPERPNT 27 (SUTURE) ×6 IMPLANT
SUT VLOC 180 0 24IN GS25 (SUTURE) ×3 IMPLANT
SYR 50ML LL SCALE MARK (SYRINGE) ×3 IMPLANT
TOWEL OR 17X26 10 PK STRL BLUE (TOWEL DISPOSABLE) ×6 IMPLANT
TOWER CARTRIDGE SMART MIX (DISPOSABLE) IMPLANT
TRAY FOLEY CATH 14FRSI W/METER (CATHETERS) ×3 IMPLANT
WATER STERILE IRR 1500ML POUR (IV SOLUTION) IMPLANT
WRAP KNEE MAXI GEL POST OP (GAUZE/BANDAGES/DRESSINGS) ×3 IMPLANT

## 2012-03-23 NOTE — Anesthesia Postprocedure Evaluation (Signed)
  Anesthesia Post-op Note  Patient: [name]  Procedure(s) Performed: Procedure(s) (LRB): IRRIGATION AND DEBRIDEMENT KNEE WITH POLY EXCHANGE (Left)  Patient Location: PACU  Anesthesia Type: Spinal  Level of Consciousness: awake and alert   Airway and Oxygen Therapy: Patient Spontanous Breathing  Post-op Pain: mild  Post-op Assessment: Post-op Vital signs reviewed, Patient's Cardiovascular Status Stable, Respiratory Function Stable, Patent Airway and No signs of Nausea or vomiting  Post-op Vital Signs: stable  Complications: No apparent anesthesia complications

## 2012-03-23 NOTE — Transfer of Care (Signed)
Immediate Anesthesia Transfer of Care Note  Patient: [name]  Procedure(s) Performed: Procedure(s) (LRB): IRRIGATION AND DEBRIDEMENT KNEE WITH POLY EXCHANGE (Left)  Patient Location: PACU  Anesthesia Type: MAC and Spinal  Level of Consciousness: awake, alert , oriented, patient cooperative and responds to stimulation  Airway & Oxygen Therapy: Patient Spontanous Breathing  Post-op Assessment: Report given to PACU RN and Post -op Vital signs reviewed and stable, able to move upper extremities, spinal at T11  Post vital signs: Reviewed and stable  Complications: No apparent anesthesia complications

## 2012-03-23 NOTE — Op Note (Signed)
Latoya, Reese NO.:  [id number]  MEDICAL RECORD NO.:  [id number]  LOCATION:  1619                         FACILITY:  [facility]  PHYSICIAN:  Madlyn Frankel. Charlann Boxer, M.D.  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  PROCEDURES: 1. Left knee excision, debridement of scar involving the medial     lateral superior and inferior patellar region. 2. Polyethylene exchange revision with changing from a size 3 x 10 mm     insert to a 3 x 15 mm insert.  SURGEON:  Madlyn Frankel. Charlann Boxer, M.D.  ASSISTANT:  Lanney Gins, PA  Please note that Mr. Carmon Sails was present for the entirety of the case, utilized for general facilitation of the case, retractor management and primary wound closure.  ANESTHESIA:  Spinal.  SPECIMENS:  None.  COMPLICATIONS:  None apparent.  TOURNIQUET TIME:  20 minutes at 250 mmHg.  BLOOD LOSS:  Minimal.  DRAINS:  One medium Hemovac.  INDICATIONS FOR PROCEDURE:  Ms. [name] is a 61 year old female with a history of left knee replacement in 2008.  She had done well, but developed some sense of instability with her knee, with recurrent swellings and crepitation.  Examination raised no concern for infection. Given the persistence of her problems, she wished to proceed with more definitive measures.  We discussed about scar debridement and polyethylene exchange.  She wished to proceed in this fashion.  Risks of recurrence of instability and recurrent scar in addition to standard risk of  infection, DVT, and future revision for her full components were all discussed, reviewed, consent was not obtained.  PROCEDURE IN DETAIL:  The patient was brought to the operative theater. Once adequate anesthesia, preop antibiotics, Ancef administered she was positioned supine and a left thigh tourniquet placed.  The left lower extremity was then prepped and draped in  sterile fashion with the foot placed in a DeMayo leg holder.  Time-out was performed identifying the patient, planned procedure, and extremity.  The leg was exsanguinated, tourniquet elevated to 250 mmHg.  Her old incision was utilized and actually this scar portion excised, the soft tissue planes were created. A median arthrotomy was then made encountering clear synovial fluid.  At this point, I debrided the medial synovial scar in the lateral suprapatellar and inferior patellar scar.  Following a proximal medial peel, the knee was subluxated anteriorly and the old polyethylene removed.  Trial components were opened and following debridement of the posterior aspect of the knee as well as confirming further adequate debridement along the lateral aspect of the joint, the trial components were placed.  The 15 mm insert was chosen based on a preoperative assessment of hyperextension and instability from extension to flexion.  With the 15 mm insert she came to full extension with stable ligaments  in extension and flexion.  Given all these findings the trial components were removed, we irrigated the knee with normal saline solution, pulse lavage approximately 1500 mL.  The final 3 x 10 mm insert was then placed into the knee.  The knee was re- irrigated.  Tourniquet was let down after 20 minutes.  A medium Hemovac drain was placed deep.  Extensor mechanism was then reapproximated with the knee in flexion.  The remainder of the wound was closed with 2-0 Vicryl, and staples on the skin.  The skin was cleaned, dried and dressed  sterilely using Xeroform, dressing sponges and bulky dressing. She was brought to the recovery room in stable condition, tolerating the procedure well.     Madlyn Frankel Charlann Boxer, M.D.     MDO/MEDQ  D:  03/23/2012  T:  03/23/2012  Job:  [id number]

## 2012-03-23 NOTE — Interval H&P Note (Signed)
History and Physical Interval Note:  03/23/2012 7:18 AM  [name]  has presented today for surgery, with the diagnosis of s/p left total knee with patella clunk,scar  The various methods of treatment have been discussed with the patient and family. After consideration of risks, benefits and other options for treatment, the patient has consented to  Procedure(s) (LRB):LEFT TOTAL KNEE REVISION WITH SCAR DEBRIDEMENT/PATELLA REVISION WITH POLY EXCHANGE (Left) as a surgical intervention .  The patients' history has been reviewed, patient examined, no change in status, stable for surgery.  I have reviewed the patients' chart and labs.  Questions were answered to the patient's satisfaction.     Shelda Pal

## 2012-03-23 NOTE — Preoperative (Signed)
Beta Blockers   Reason not to administer Beta Blockers:Not Applicable, not on home bb 

## 2012-03-23 NOTE — Anesthesia Procedure Notes (Signed)
Spinal Patient location during procedure: OR Staffing Anesthesiologist: Vernel Donlan Performed by: anesthesiologist  Preanesthetic Checklist Completed: patient identified, site marked, surgical consent, pre-op evaluation, timeout performed, IV checked, risks and benefits discussed and monitors and equipment checked Spinal Block Patient position: sitting Prep: Betadine Patient monitoring: heart rate, continuous pulse ox and blood pressure Approach: left paramedian Location: L3-4 Injection technique: single-shot Needle Needle type: Spinocan  Needle gauge: 22 G Needle length: 9 cm Additional Notes Expiration date of kit checked and confirmed. Patient tolerated procedure well, without complications.     

## 2012-03-23 NOTE — Brief Op Note (Signed)
03/23/2012  [facility]  MRN: [id number] CSN: [id number]   8:43 AM  PATIENT:  [name]  61 y.o. female  PRE-OPERATIVE DIAGNOSIS:  s/p left total knee with patella clunk,scar and instability, global, extension to flexion  POST-OPERATIVE DIAGNOSIS:  s/p left total knee with patella clunk,scar and instability, global, extension to flexion  PROCEDURE:  Procedure(s) (LRB):Exicisonal debridement left knee, scar excision with polyethylene exchange (3X10 to 3X15 PS insert) IRRIGATION AND DEBRIDEMENT KNEE WITH POLY EXCHANGE (Left)  SURGEON:  Surgeon(s) and Role:    * Shelda Pal, MD - Primary  PHYSICIAN ASSISTANT: Lanney Gins, PA-C  ANESTHESIA:   spinal  EBL:  Total I/O In: 2000 [I.V.:2000] Out: 150 [Urine:150]  BLOOD ADMINISTERED:none  DRAINS: (1 medium) Hemovact drain(s) in the left knee with  Suction Open   LOCAL MEDICATIONS USED:  MARCAINE with epi and toradol  SPECIMEN:  No Specimen  DISPOSITION OF SPECIMEN:  N/A  COUNTS:  YES  TOURNIQUET:   Total Tourniquet Time Documented: Thigh (Left) - 21 minutes  DICTATION: .Other Dictation: Dictation Number [id number]  PLAN OF CARE: Admit to inpatient   PATIENT DISPOSITION:  PACU - hemodynamically stable.   Delay start of Pharmacological VTE agent (>24hrs) due to surgical blood loss or risk of bleeding: no

## 2012-03-23 NOTE — Anesthesia Preprocedure Evaluation (Addendum)
Anesthesia Evaluation  Patient identified by MRN, date of birth, ID band Patient awake    Reviewed: Allergy & Precautions, H&P , NPO status , Patient's Chart, lab work & pertinent test results  History of Anesthesia Complications Negative for: history of anesthetic complications  Airway Mallampati: II TM Distance: >3 FB Neck ROM: Full    Dental No notable dental hx.    Pulmonary neg pulmonary ROS,  breath sounds clear to auscultation  Pulmonary exam normal       Cardiovascular negative cardio ROS  Rhythm:Regular Rate:Normal     Neuro/Psych negative neurological ROS  negative psych ROS   GI/Hepatic negative GI ROS, Neg liver ROS,   Endo/Other  negative endocrine ROS  Renal/GU negative Renal ROS  negative genitourinary   Musculoskeletal negative musculoskeletal ROS (+)   Abdominal   Peds negative pediatric ROS (+)  Hematology negative hematology ROS (+)   Anesthesia Other Findings   Reproductive/Obstetrics negative OB ROS                          Anesthesia Physical Anesthesia Plan  ASA: II  Anesthesia Plan: Spinal   Post-op Pain Management:    Induction:   Airway Management Planned:   Additional Equipment:   Intra-op Plan:   Post-operative Plan: Extubation in OR  Informed Consent: I have reviewed the patients History and Physical, chart, labs and discussed the procedure including the risks, benefits and alternatives for the proposed anesthesia with the patient or authorized representative who has indicated his/her understanding and acceptance.   Dental advisory given  Plan Discussed with: CRNA  Anesthesia Plan Comments:         Anesthesia Quick Evaluation

## 2012-03-24 LAB — BASIC METABOLIC PANEL
Calcium: 8.8 mg/dL (ref 8.4–10.5)
GFR calc Af Amer: >90 mL/min (ref >90)
GFR calc non Af Amer: >90 mL/min (ref >90)
Glucose, Bld: 121 mg/dL — ABNORMAL HIGH (ref 70–99)
Potassium: 4 mEq/L (ref 3.5–5.1)
Sodium: 135 mEq/L (ref 135–145)

## 2012-03-24 LAB — CBC
Hemoglobin: 12 g/dL (ref 12.0–15.0)
MCH: 30.4 pg (ref 26.0–34.0)
MCHC: 33.5 g/dL (ref 30.0–36.0)
Platelets: 219 10*3/uL (ref 150–400)
RDW: 13.5 % (ref 11.5–15.5)

## 2012-03-24 MED ORDER — OXYCODONE HCL 5 MG PO TABS
5.0000 mg | ORAL_TABLET | ORAL | Status: DC | PRN
  Administered 2012-03-24: 10 mg via ORAL
  Administered 2012-03-24: 5 mg via ORAL
  Filled 2012-03-24: qty 1
  Filled 2012-03-24 (×2): qty 2

## 2012-03-24 MED ORDER — DSS 100 MG PO CAPS: 100.0000 mg | ORAL_CAPSULE | Freq: Two times a day (BID) | ORAL | Status: AC

## 2012-03-24 MED ORDER — POLYETHYLENE GLYCOL 3350 17 G PO PACK: 17.0000 g | PACK | Freq: Two times a day (BID) | ORAL | Status: AC

## 2012-03-24 MED ORDER — ASPIRIN EC 325 MG PO TBEC: 325.0000 mg | DELAYED_RELEASE_TABLET | Freq: Two times a day (BID) | ORAL | Status: AC

## 2012-03-24 MED ORDER — HYDROCODONE-ACETAMINOPHEN 7.5-325 MG PO TABS
1.0000 | ORAL_TABLET | ORAL | Status: DC
  Administered 2012-03-24 – 2012-03-25 (×5): 2 via ORAL
  Filled 2012-03-24 (×6): qty 2

## 2012-03-24 MED ORDER — CELECOXIB 200 MG PO CAPS: 200.0000 mg | ORAL_CAPSULE | Freq: Two times a day (BID) | ORAL | Status: DC

## 2012-03-24 MED ORDER — METHOCARBAMOL 500 MG PO TABS: 500.0000 mg | ORAL_TABLET | Freq: Four times a day (QID) | ORAL | Status: AC | PRN

## 2012-03-24 MED ORDER — OXYCODONE HCL 5 MG PO TABS: 5.0000 mg | ORAL_TABLET | ORAL | Status: AC | PRN

## 2012-03-24 MED ORDER — FERROUS SULFATE 325 (65 FE) MG PO TABS: 325.0000 mg | ORAL_TABLET | Freq: Three times a day (TID) | ORAL | Status: DC

## 2012-03-24 MED ORDER — DIPHENHYDRAMINE HCL 25 MG PO CAPS
25.0000 mg | ORAL_CAPSULE | Freq: Four times a day (QID) | ORAL | Status: DC | PRN
  Administered 2012-03-24: 25 mg via ORAL
  Filled 2012-03-24: qty 1

## 2012-03-24 NOTE — Progress Notes (Signed)
Physical Therapy Treatment Patient Details Name: [name] MRN: [id number] DOB: June 18, 1951 Today's Date: 03/24/2012 Time: 4098-1191 PT Time Calculation (min): 21 min  PT Assessment / Plan / Recommendation Comments on Treatment Session       Follow Up Recommendations  Home health PT    Barriers to Discharge        Equipment Recommendations  Rolling walker with 5" wheels    Recommendations for Other Services OT consult  Frequency 7X/week   Plan Discharge plan remains appropriate    Precautions / Restrictions Precautions Precautions: Knee Required Braces or Orthoses: Knee Immobilizer - Left Knee Immobilizer - Left: Discontinue once straight leg raise with < 10 degree lag Restrictions Weight Bearing Restrictions: No Other Position/Activity Restrictions: WBAT   Pertinent Vitals/Pain 7/10; pt has been declining pain meds 2* nausea    Mobility  Bed Mobility Bed Mobility: Sit to Supine Sit to Supine: 4: Min assist Details for Bed Mobility Assistance: cues for sequence and to self assist with UEs - min assist for L LE Transfers Transfers: Stand to Sit;Sit to Stand Sit to Stand: 4: Min assist Stand to Sit: 4: Min assist Details for Transfer Assistance: cues for use of UEs and for LE management Ambulation/Gait Ambulation/Gait Assistance: 4: Min assist Ambulation Distance (Feet): 174 Feet Assistive device: Rolling walker Ambulation/Gait Assistance Details: cues for posture and position from RW Gait Pattern: Step-to pattern    Exercises     PT Diagnosis:    PT Problem List:   PT Treatment Interventions:     PT Goals Acute Rehab PT Goals PT Goal Formulation: With patient Time For Goal Achievement: 03/30/12 Potential to Achieve Goals: Good Pt will go Supine/Side to Sit: with supervision PT Goal: Supine/Side to Sit - Progress: Progressing toward goal Pt will go Sit to Supine/Side: with supervision PT Goal: Sit to Supine/Side - Progress: Progressing toward goal Pt  will go Sit to Stand: with supervision PT Goal: Sit to Stand - Progress: Progressing toward goal Pt will go Stand to Sit: with supervision PT Goal: Stand to Sit - Progress: Progressing toward goal Pt will Ambulate: 51 - 150 feet;with supervision;with rolling walker PT Goal: Ambulate - Progress: Progressing toward goal Pt will Go Up / Down Stairs: 1-2 stairs;with min assist;with least restrictive assistive device PT Goal: Up/Down Stairs - Progress: Goal set today  Visit Information  Last PT Received On: 03/24/12 Assistance Needed: +1    Subjective Data  Patient Stated Goal: Resume previous lifestyle with decreased pain and increased knee stability   Cognition  Overall Cognitive Status: Appears within functional limits for tasks assessed/performed Arousal/Alertness: Awake/alert Orientation Level: Appears intact for tasks assessed Behavior During Session: [facility] for tasks performed    Balance     End of Session PT - End of Session Equipment Utilized During Treatment: Left knee immobilizer Activity Tolerance: Patient tolerated treatment well Patient left: in bed;with call bell/phone within reach Nurse Communication: Mobility status    Gennaro Lizotte 03/24/2012, 2:35 PM

## 2012-03-24 NOTE — Care Management Note (Unsigned)
    Page 1 of 2   03/24/2012     4:33:13 PM   CARE MANAGEMENT NOTE 03/24/2012  Patient:  [name], [name]   Account Number:  [id number]  Date Initiated:  03/24/2012  Documentation initiated by:  Colleen Can  Subjective/Objective Assessment:   DX S/P LEFT TOTAL KNEE WITH PATELLA CLUNK; LEFT KNEE EXCISION, DEBRIDEMNTOF SCAR IN MEDIAL LATERAL SUPERIOR AND INFERIOR PATELLA REGION, POLYETHYLENE REVISION     Action/Plan:   CM SPOKE WITH PATIENT. PLANS TO RETURN TO [place] WHERE GRAND DAUGHTER AND SPOUSE WILL BE CAREGIVERS. HH CHOICE OFFERED, REQUESTING [facility]   Anticipated DC Date:  03/25/2012   Anticipated DC Plan:  HOME W HOME HEALTH SERVICES  In-house referral  NA      DC Planning Services  CM consult      [facility] Choice  HOME HEALTH  DURABLE MEDICAL EQUIPMENT   Choice offered to / List presented to:  C-1 Patient   DME arranged  [facility] Hurst      DME agency  [facility]     HH arranged  HH-2 PT      [facility] agency  [facility]   Status of service:  In process, will continue to follow Medicare Important Message given?   (If response is "NO", the following Medicare IM given date fields will be blank) Date Medicare IM given:   Date Additional Medicare IM given:    Discharge Disposition:    Per UR Regulation:  Reviewed for med. necessity/level of care/duration of stay  If discussed at Long Length of Stay Meetings, dates discussed:    Comments:  03/24/2012 Raynelle Bring BSN CCM [phone] [facility] WILL SERVICE PATIENT WITH HH PT AND PROVIDE DME; SERVICES WILL START DAY AFTER PT IS DISCHARGED. LIST OF HH AGENCIES PLACED ON CHART.

## 2012-03-24 NOTE — Progress Notes (Signed)
Subjective: 1 Day Post-Op Procedure(s) (LRB): IRRIGATION AND DEBRIDEMENT KNEE WITH POLY EXCHANGE (Left)   Patient reports pain as moderate, needing IV medication to control pain. No other events throughout the night. Complaining of pain on the inside aspect of the knee, where the incision was made.  Objective:   VITALS:   Filed Vitals:   03/24/12 0535  BP: 117/80  Pulse: 69  Temp: 98 F (36.7 C)  Resp: 16    Neurovascular intact Dorsiflexion/Plantar flexion intact Incision: dressing C/D/I No cellulitis present Compartment soft  LABS  Basename 03/24/12 0436  HGB 12.0  HCT 35.8*  WBC 14.0*  PLT 219     Basename 03/24/12 0436  NA 135  K 4.0  BUN 11  CREATININE 0.71  GLUCOSE 121*     Assessment/Plan: 1 Day Post-Op Procedure(s) (LRB): IRRIGATION AND DEBRIDEMENT KNEE WITH POLY EXCHANGE (Left)   HV drain d/c'ed Foley cath d/c'ed Advance diet Up with therapy D/C IV fluids Discharge home with home health when ready   Anastasio Auerbach. Mikaeel Petrow   PAC  03/24/2012, 9:35 AM

## 2012-03-24 NOTE — Evaluation (Signed)
Physical Therapy Evaluation Patient Details Name: [name] MRN: [id number] DOB: [birth year] Today's Date: 03/24/2012 Time: 4098-1191 PT Time Calculation (min): 38 min  PT Assessment / Plan / Recommendation Clinical Impression  Pt with L TKR I&D with poly exchange presents with decreased L LE strength/ROM  and limitations in functional mobility.    PT Assessment  Patient needs continued PT services    Follow Up Recommendations  Home health PT    Barriers to Discharge        lEquipment Recommendations  Rolling walker with 5" wheels (pt is ~5'1")    Recommendations for Other Services OT consult   Frequency 7X/week    Precautions / Restrictions Precautions Precautions: Knee Required Braces or Orthoses: Knee Immobilizer - Left Restrictions Weight Bearing Restrictions: No Other Position/Activity Restrictions: WBAT   Pertinent Vitals/Pain 5/10; premedicated, cold packs provided.      Mobility  Bed Mobility Bed Mobility: Supine to Sit Supine to Sit: 4: Min assist Details for Bed Mobility Assistance: cues for sequence and to self assist with UEs - min assist for L LE Transfers Transfers: Stand to Sit;Sit to Stand Sit to Stand: 4: Min assist Stand to Sit: 4: Min assist Details for Transfer Assistance: cues for use of UEs and for LE management Ambulation/Gait Ambulation/Gait Assistance: 4: Min assist Ambulation Distance (Feet): 100 Feet (100' + 25') Assistive device: Rolling walker Ambulation/Gait Assistance Details: cues for sequence, posture and position from RW Gait Pattern: Step-to pattern    Exercises Total Joint Exercises Ankle Circles/Pumps: AROM;10 reps;Both;Supine Quad Sets: AROM;10 reps;Both;Supine Heel Slides: AAROM;10 reps;Left;Supine Straight Leg Raises: AAROM;10 reps;Left;Supine   PT Diagnosis: Difficulty walking  PT Problem List: Decreased strength;Decreased range of motion;Decreased activity tolerance;Decreased mobility;Decreased knowledge of use  of DME;Pain PT Treatment Interventions: DME instruction;Gait training;Stair training;Functional mobility training;Therapeutic activities;Therapeutic exercise;Patient/family education   PT Goals Acute Rehab PT Goals PT Goal Formulation: With patient Time For Goal Achievement: 03/30/12 Potential to Achieve Goals: Good Pt will go Supine/Side to Sit: with supervision PT Goal: Supine/Side to Sit - Progress: Goal set today Pt will go Sit to Supine/Side: with supervision PT Goal: Sit to Supine/Side - Progress: Goal set today Pt will go Sit to Stand: with supervision PT Goal: Sit to Stand - Progress: Goal set today Pt will go Stand to Sit: with supervision PT Goal: Stand to Sit - Progress: Goal set today Pt will Ambulate: 51 - 150 feet;with supervision;with rolling walker PT Goal: Ambulate - Progress: Goal set today Pt will Go Up / Down Stairs: 1-2 stairs;with min assist;with least restrictive assistive device PT Goal: Up/Down Stairs - Progress: Goal set today  Visit Information  Last PT Received On: 03/24/12 Assistance Needed: +1    Subjective Data  Subjective: I kept falling at home because my knee would give out Patient Stated Goal: Resume previous lifestyle with decreased pain and increased knee stability   Prior Functioning  Home Living Lives With: Alone Available Help at Discharge: Family Type of Home: House Home Access: Stairs to enter Secretary/administrator of Steps: 2 Entrance Stairs-Rails: None Home Layout: One level Home Adaptive Equipment: Crutches Prior Function Level of Independence: Independent Able to Take Stairs?: Yes Driving: Yes Vocation: Full time employment Communication Communication: No difficulties    Cognition  Overall Cognitive Status: Appears within functional limits for tasks assessed/performed Arousal/Alertness: Awake/alert Orientation Level: Appears intact for tasks assessed Behavior During Session: [facility] for tasks performed    Extremity/Trunk  Assessment Right Upper Extremity Assessment RUE ROM/Strength/Tone: Within functional levels Left Upper  Extremity Assessment LUE ROM/Strength/Tone: Within functional levels Right Lower Extremity Assessment RLE ROM/Strength/Tone: Within functional levels Left Lower Extremity Assessment LLE ROM/Strength/Tone: Deficits LLE ROM/Strength/Tone Deficits: -5 - 40 AAROM at knee; 3-/5 quads   Balance    End of Session PT - End of Session Equipment Utilized During Treatment: Left knee immobilizer Activity Tolerance: Patient tolerated treatment well Patient left: in chair;with call bell/phone within reach Nurse Communication: Mobility status   Dora Simeone 03/24/2012, 11:16 AM

## 2012-03-24 NOTE — Progress Notes (Signed)
Patient complaining of itching that is progressively getting worse. On call PA Oneida Alar paged with orders to give Bendryl 25mg  PO q6 hours PRN for itching. Will continue to monitor.

## 2012-03-24 NOTE — Discharge Summary (Signed)
Physician Discharge Summary  Patient ID: [name] MRN: [id number] DOB/AGE: 1951/06/08 61 y.o.  Admit date: 03/23/2012 Discharge date: 03/25/2012  Procedures:  Procedure(s) (LRB): IRRIGATION AND DEBRIDEMENT KNEE WITH POLY EXCHANGE (Left)  Attending Physician:  Dr. 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 female complaining of left knee pain for a couple of month. Also having symptoms of clicking, popping, locking and giving out. Pain had continually increased since the beginning. X-rays in the clinic show the components of the left total knee arthroplasty to be in good position. Various options are discussed with the patient. Risks, benefits and expectations were discussed with the patient. Patient understand the risks, benefits and expectations and wishes to proceed with surgery.  PCP: No primary provider on file.   Discharged Condition: good  Hospital Course:  Patient underwent the above stated procedure on 03/23/2012. Patient tolerated the procedure well and brought to the recovery room in good condition and subsequently to the floor.  POD #1 BP: 117/80 ; Pulse: 69 ; Temp: 98 F (36.7 C) ; Resp: 16 Pt's foley was removed, as well as the hemovac drain removed. IV was changed to a saline lock. Patient reports pain as moderate, needing IV medication to control pain. No other events throughout the night.  Complaining of pain on the inside aspect of the knee, where the incision was made. Neurovascular intact, dorsiflexion/plantar flexion intact, incision: dressing C/D/I, no cellulitis present and compartment soft.   LABS  Basename  03/24/12 0436   HGB  12.0  HCT  35.8   POD #2  BP: 107/69 ; Pulse: 72 ;  Temp: 98.1 F (36.7 C) ; Resp: 18  Patient reports pain as mild, pain well controlled. No events throughout the night. Ready to be discharged home. Neurovascular intact, dorsiflexion/plantar flexion intact, incision: dressing C/D/I, no cellulitis present and compartment soft.   LABS  Basename  03/25/12 0443   HGB  11.1  HCT  33.1    Discharge Exam: General appearance: alert, cooperative and no distress Extremities: Homans sign is negative, no sign of DVT, no edema, redness or tenderness in the calves or thighs and no ulcers, gangrene or trophic changes  Disposition:  Home with follow up in 2 weeks   Discharge Orders    Future Orders Please Complete By Expires   Diet - low sodium heart healthy      Call MD / Call 911      Comments:   If you experience chest pain or shortness of breath, CALL 911 and be transported to the hospital emergency room.  If you develope a fever above 101 F, pus (white drainage) or increased drainage or redness at the wound, or calf pain, call your surgeon's office.   Discharge instructions      Comments:   Maintain surgical dressing for 8 days, then replace with gauze and tape. Keep the area dry and clean until follow up. Follow up in 2 weeks at [facility]. Call with any questions or concerns.     Constipation Prevention      Comments:   Drink plenty of fluids.  Prune juice may be helpful.  You may use a stool softener, such as  Colace (over the counter) 100 mg twice a day.  Use MiraLax (over the counter) for constipation as needed.   Increase activity slowly as tolerated      Driving restrictions      Comments:   No driving for 4 weeks   TED hose      Comments:   Use stockings (TED hose) for 2 weeks on both leg(s).  You may remove them at night for sleeping.   Change dressing      Comments:   Maintain surgical dressing for 8 days, then change the dressing daily with sterile 4 x 4 inch gauze dressing and tape. Keep the area dry and clean.      Current Discharge Medication List    START taking these medications   Details  aspirin EC 325 MG tablet Take 1 tablet (325 mg total) by mouth 2 (two) times daily. X 4 weeks Qty: 60 tablet, Refills: 0    celecoxib (CELEBREX) 200 MG capsule Take 1 capsule (200 mg total) by mouth every 12 (twelve) hours. Qty: 60 capsule, Refills: 0    diphenhydrAMINE (BENADRYL) 25 mg capsule Take 1 capsule (25 mg total) by mouth every 6 (six) hours as needed for itching.    docusate sodium 100 MG CAPS Take 100 mg by mouth 2 (two) times daily.    ferrous sulfate 325 (65 FE) MG tablet Take 1 tablet (325 mg total) by mouth 3 (three) times daily after meals.    HYDROcodone-acetaminophen (NORCO) 7.5-325 MG per tablet Take 1-2 tablets by mouth every 4 (four) hours as needed for pain. Qty: 120 tablet, Refills: 0    methocarbamol (ROBAXIN) 500 MG tablet Take 1 tablet (500 mg total) by mouth every 6 (six) hours as needed (muscle spasms).    oxyCODONE (OXY IR/ROXICODONE) 5 MG immediate release tablet Take 1-3 tablets (5-15 mg total) by mouth every 4 (four) hours as needed for pain. Qty: 120 tablet, Refills: 0    polyethylene glycol (MIRALAX / GLYCOLAX) packet Take 17 g by mouth 2 (two) times daily.      CONTINUE these medications which have NOT CHANGED   Details  acetaminophen (TYLENOL) 500 MG tablet Take 1,000 mg by mouth every 6 (six) hours as needed. For pain    amoxicillin (AMOXIL) 500 MG capsule Take 500 mg by mouth See admin instructions. Take 4 capsules by mouth before dental procedures    Calcium Carbonate-Vitamin D (CALCIUM + D PO) Take 1 tablet by mouth daily.    fish oil-omega-3 fatty acids 1000 MG capsule Take 1,200 mg by mouth daily.    Multiple Vitamin (MULITIVITAMIN WITH MINERALS) TABS Take 1 tablet by mouth daily.    vitamin B-12 (CYANOCOBALAMIN) 100 MCG tablet Take 100 mcg by mouth daily.    vitamin C (ASCORBIC ACID) 500 MG tablet Take 500 mg by mouth daily.    vitamin E 400 UNIT  capsule Take 400 Units by mouth daily.      STOP taking these medications     ibuprofen (ADVIL,MOTRIN) 200 MG tablet Comments:  Reason for Stopping:            Follow-up Information    Follow up with Shelda Pal, MD. Schedule an appointment as soon as possible for a visit in 2 weeks.   Contact information:   [facility] [address], [address] [place] [address] [phone]          Signed:  Anastasio Auerbach. Tyrae Alcoser   PAC  03/25/2012, 9:57  AM

## 2012-03-25 ENCOUNTER — Encounter (HOSPITAL_COMMUNITY): Payer: Self-pay | Admitting: Orthopedic Surgery

## 2012-03-25 LAB — CBC
Hemoglobin: 11.1 g/dL — ABNORMAL LOW (ref 12.0–15.0)
MCH: 30.7 pg (ref 26.0–34.0)
Platelets: 180 10*3/uL (ref 150–400)
RBC: 3.61 MIL/uL — ABNORMAL LOW (ref 3.87–5.11)
WBC: 7.9 10*3/uL (ref 4.0–10.5)

## 2012-03-25 LAB — BASIC METABOLIC PANEL
CO2: 28 mEq/L (ref 19–32)
Calcium: 8.3 mg/dL — ABNORMAL LOW (ref 8.4–10.5)
Chloride: 103 mEq/L (ref 96–112)
Glucose, Bld: 96 mg/dL (ref 70–99)
Potassium: 3.9 mEq/L (ref 3.5–5.1)
Sodium: 137 mEq/L (ref 135–145)

## 2012-03-25 MED ORDER — DIPHENHYDRAMINE HCL 25 MG PO CAPS: 25.0000 mg | ORAL_CAPSULE | Freq: Four times a day (QID) | ORAL | Status: DC | PRN

## 2012-03-25 MED ORDER — CELECOXIB 200 MG PO CAPS: 200.0000 mg | ORAL_CAPSULE | Freq: Two times a day (BID) | ORAL | Status: AC

## 2012-03-25 MED ORDER — HYDROCODONE-ACETAMINOPHEN 7.5-325 MG PO TABS: 1.0000 | ORAL_TABLET | ORAL | Status: AC | PRN

## 2012-03-25 NOTE — Evaluation (Signed)
Occupational Therapy Evaluation Patient Details Name: [name] MRN: [id number] DOB: [birth year] Today's Date: 03/25/2012 Time: 4098-1191 OT Time Calculation (min): 25 min  OT Assessment / Plan / Recommendation Clinical Impression  Pt is s/p L knee scar revision with poly exchange and is supposed to discharge today. Feel pt is progressing well and doesnt need OT followup. Has assist available at discharge.     OT Assessment  Patient does not need any further OT services    Follow Up Recommendations  No OT follow up    Barriers to Discharge      Equipment Recommendations  Rolling walker with 5" wheels;None recommended by OT    Recommendations for Other Services    Frequency       Precautions / Restrictions Precautions Precautions: Knee Required Braces or Orthoses: Knee Immobilizer - Left Knee Immobilizer - Left: Discontinue once straight leg raise with < 10 degree lag Restrictions Weight Bearing Restrictions: No        ADL  Eating/Feeding: Simulated;Independent Where Assessed - Eating/Feeding: Chair Grooming: Wash/dry hands;Minimal assistance;Other (comment) (min guard) Where Assessed - Grooming: Unsupported standing Upper Body Bathing: Simulated;Chest;Right arm;Left arm;Abdomen;Set up Where Assessed - Upper Body Bathing: Unsupported sitting Lower Body Bathing: Simulated;Minimal assistance Where Assessed - Lower Body Bathing: Supported sit to stand Upper Body Dressing: Simulated;Set up Where Assessed - Upper Body Dressing: Unsupported sitting Lower Body Dressing: Simulated;Moderate assistance Where Assessed - Lower Body Dressing: Sopported sit to stand Toilet Transfer: Performed;Min guard Toilet Transfer Method: Other (comment) (ambulating) Toilet Transfer Equipment: Raised toilet seat with arms (or 3-in-1 over toilet) Toileting - Clothing Manipulation and Hygiene: Simulated;Min guard Where Assessed - Engineer, mining and Hygiene:  Standing Tub/Shower Transfer: Performed;Minimal assistance Tub/Shower Transfer Method: Other (comment) (step back over ledge) Equipment Used: Rolling walker ADL Comments: Pt states her granddaighters are available to help at discharge.     OT Diagnosis:    OT Problem List:   OT Treatment Interventions:     OT Goals    Visit Information  Last OT Received On: 03/25/12 Assistance Needed: +1    Subjective Data  Subjective: I should probably practice the shower transfer Patient Stated Goal: home   Prior Functioning  Home Living Lives With: Alone Available Help at Discharge: Family Type of Home: House Home Access: Stairs to enter Secretary/administrator of Steps: 2 Entrance Stairs-Rails: None Home Layout: One level Bathroom Shower/Tub: Health visitor: Handicapped height Home Adaptive Equipment: Crutches;Bedside commode/3-in-1 Prior Function Level of Independence: Independent Able to Take Stairs?: Yes Driving: Yes Vocation: Full time employment Communication Communication: No difficulties    Cognition  Overall Cognitive Status: Appears within functional limits for tasks assessed/performed Arousal/Alertness: Awake/alert Orientation Level: Appears intact for tasks assessed Behavior During Session: [facility] for tasks performed    Extremity/Trunk Assessment Right Upper Extremity Assessment RUE ROM/Strength/Tone: Within functional levels Left Upper Extremity Assessment LUE ROM/Strength/Tone: Within functional levels   Mobility Bed Mobility Bed Mobility: Supine to Sit Supine to Sit: 5: Supervision;HOB elevated Transfers Transfers: Sit to Stand;Stand to Sit Sit to Stand: 4: Min guard;With upper extremity assist;From bed;From chair/3-in-1 Stand to Sit: 4: Min guard;With upper extremity assist;To chair/3-in-1   Exercise    Balance    End of Session OT - End of Session Activity Tolerance: Patient tolerated treatment well Patient left: in chair;with call  bell/phone within reach   Lennox Laity [phone] 03/25/2012, 12:16 PM

## 2012-03-25 NOTE — Progress Notes (Signed)
Subjective: 2 Days Post-Op Procedure(s) (LRB): IRRIGATION AND DEBRIDEMENT KNEE WITH POLY EXCHANGE (Left)   Patient reports pain as mild, pain well controlled. No events throughout the night. Ready to be discharged home.  Objective:   VITALS:   Filed Vitals:   03/25/12 0550  BP: 107/69  Pulse: 72  Temp: 98.1 F (36.7 C)  Resp: 18    Neurovascular intact Dorsiflexion/Plantar flexion intact Incision: dressing C/D/I No cellulitis present Compartment soft  LABS  Basename 03/25/12 0443 03/24/12 0436  HGB 11.1* 12.0  HCT 33.1* 35.8*  WBC 7.9 14.0*  PLT 180 219     Basename 03/25/12 0443 03/24/12 0436  NA 137 135  K 3.9 4.0  BUN 12 11  CREATININE 0.78 0.71  GLUCOSE 96 121*     Assessment/Plan: 2 Days Post-Op Procedure(s) (LRB): IRRIGATION AND DEBRIDEMENT KNEE WITH POLY EXCHANGE (Left)   Up with therapy Discharge home with home health Follow up in 2 weeks at [facility].  Follow-up Information    Follow up with OLIN,Achillies Buehl D in 2 weeks.   Contact information:   [facility] [address] [place] [address] [phone]          Anastasio Auerbach. Valiant Dills   PAC  03/25/2012, 9:52 AM

## 2012-03-25 NOTE — Progress Notes (Signed)
Physical Therapy Treatment Patient Details Name: [name] MRN: [id number] DOB: 1951-06-26 Today's Date: 03/25/2012 Time: 4098-1191 PT Time Calculation (min): 23 min  PT Assessment / Plan / Recommendation Comments on Treatment Session  Pt requests rest break before ambulating/stairs    Follow Up Recommendations  Home health PT    Barriers to Discharge        Equipment Recommendations  Rolling walker with 5" wheels;None recommended by OT    Recommendations for Other Services OT consult  Frequency 7X/week   Plan Discharge plan remains appropriate    Precautions / Restrictions Precautions Precautions: Knee Required Braces or Orthoses: Knee Immobilizer - Left Knee Immobilizer - Left: Discontinue once straight leg raise with < 10 degree lag Restrictions Weight Bearing Restrictions: No Other Position/Activity Restrictions: WBAT   Pertinent Vitals/Pain 6/10; premedicated; cold packs applied    Mobility  Bed Mobility Bed Mobility: Supine to Sit Supine to Sit: 5: Supervision;HOB elevated Transfers Sit to Stand: 4: Min guard;With upper extremity assist;From bed;From chair/3-in-1 Stand to Sit: 4: Min guard;With upper extremity assist;To chair/3-in-1    Exercises Total Joint Exercises Ankle Circles/Pumps: AROM;20 reps;Supine;Both Quad Sets: AROM;20 reps;Both;Supine Heel Slides: AAROM;20 reps;Both;Supine Straight Leg Raises: AAROM;20 reps;Supine;Left   PT Diagnosis:    PT Problem List:   PT Treatment Interventions:     PT Goals Acute Rehab PT Goals PT Goal Formulation: With patient  Visit Information  Last PT Received On: 03/25/12 Assistance Needed: +1    Subjective Data  Subjective: I need to know how to do the stairs Patient Stated Goal: Resume previous lifestyle with decreased pain and increased knee stability   Cognition  Overall Cognitive Status: Appears within functional limits for tasks assessed/performed Arousal/Alertness: Awake/alert Orientation  Level: Appears intact for tasks assessed Behavior During Session: [facility] for tasks performed    Balance     End of Session PT - End of Session Activity Tolerance: Patient tolerated treatment well Patient left: in chair;with call bell/phone within reach    [facility] 03/25/2012, 1:03 PM

## 2012-03-25 NOTE — Progress Notes (Signed)
CARE MANAGEMENT NOTE 03/25/2012  Patient:  [name], [name]   Account Number:  [id number]  Date Initiated:  03/24/2012  Documentation initiated by:  Colleen Can  Subjective/Objective Assessment:   DX S/P LEFT TOTAL KNEE WITH PATELLA CLUNK; LEFT KNEE EXCISION, DEBRIDEMNTOF SCAR IN MEDIAL LATERAL SUPERIOR AND INFERIOR PATELLA REGION, POLYETHYLENE REVISION     Action/Plan:   CM SPOKE WITH PATIENT. PLANS TO RETURN TO [place] WHERE GRAND DAUGHTER AND SPOUSE WILL BE CAREGIVERS. HH CHOICE OFFERED, REQUESTING [facility]   Anticipated DC Date:  03/25/2012   Anticipated DC Plan:  HOME W HOME HEALTH SERVICES  In-house referral  NA      DC Planning Services  CM consult      [facility] Choice  HOME HEALTH  DURABLE MEDICAL EQUIPMENT   Choice offered to / List presented to:  C-1 Patient   DME arranged  Levan Hurst      DME agency  [facility]     HH arranged  HH-2 PT      [facility] agency  [facility]   Status of service:  Completed, signed off Medicare Important Message given?  no (If response is "NO", the following Medicare IM given date fields will be blank)   Discharge Disposition:  Home with home health services  Per UR Regulation:  Reviewed for med. necessity/level of care/duration of stay  Comments:  03/25/2012 Raynelle Bring BSN CCM [phone] Pt discharged today with [facility] services in place; rw was delivered to rm prior to discharge. HH services will start tomorrow 03/26/2012.

## 2012-03-25 NOTE — Progress Notes (Signed)
Physical Therapy Treatment Patient Details Name: [name] MRN: [id number] DOB: 1951/05/05 Today's Date: 03/25/2012 Time: 1042-1100 PT Time Calculation (min): 18 min  PT Assessment / Plan / Recommendation Comments on Treatment Session  Pt requests rest break before ambulating/stairs    Follow Up Recommendations  Home health PT    Barriers to Discharge        Equipment Recommendations  Rolling walker with 5" wheels;None recommended by OT    Recommendations for Other Services OT consult  Frequency 7X/week   Plan Discharge plan remains appropriate    Precautions / Restrictions Precautions Precautions: Knee Required Braces or Orthoses: Knee Immobilizer - Left Knee Immobilizer - Left: Discontinue once straight leg raise with < 10 degree lag Restrictions Weight Bearing Restrictions: No Other Position/Activity Restrictions: WBAT   Pertinent Vitals/Pain     Mobility  Bed Mobility Bed Mobility: Supine to Sit Supine to Sit: 5: Supervision;HOB elevated Transfers Transfers: Stand to Sit;Sit to Stand Sit to Stand: 4: Min guard;With upper extremity assist;From bed;From chair/3-in-1 Stand to Sit: 4: Min guard;With upper extremity assist;To chair/3-in-1 Details for Transfer Assistance: cues for use of UEs and for LE management Ambulation/Gait Ambulation/Gait Assistance: 4: Min guard;5: Supervision Ambulation Distance (Feet): 150 Feet Assistive device: Rolling walker Ambulation/Gait Assistance Details: min cues for posture and position from RW Gait Pattern: Step-to pattern Stairs: Yes Stairs Assistance: 4: Min assist Stairs Assistance Details (indicate cue type and reason): cues for sequence and for foot/RW placement Stair Management Technique: Step to pattern;Backwards;With walker;No rails Number of Stairs: 2         PT Diagnosis:    PT Problem List:   PT Treatment Interventions:     PT Goals Acute Rehab PT Goals PT Goal Formulation: With patient Time For Goal  Achievement: 03/30/12 Potential to Achieve Goals: Good Pt will go Supine/Side to Sit: with supervision PT Goal: Supine/Side to Sit - Progress: Progressing toward goal Pt will go Sit to Supine/Side: with supervision PT Goal: Sit to Supine/Side - Progress: Progressing toward goal Pt will go Sit to Stand: with supervision PT Goal: Sit to Stand - Progress: Met Pt will go Stand to Sit: with supervision PT Goal: Stand to Sit - Progress: Met Pt will Ambulate: 51 - 150 feet;with supervision;with rolling walker PT Goal: Ambulate - Progress: Met Pt will Go Up / Down Stairs: 1-2 stairs;with min assist;with least restrictive assistive device PT Goal: Up/Down Stairs - Progress: Met  Visit Information  Last PT Received On: 03/25/12 Assistance Needed: +1    Subjective Data  Subjective: I need to know how to do the stairs Patient Stated Goal: Resume previous lifestyle with decreased pain and increased knee stability   Cognition  Overall Cognitive Status: Appears within functional limits for tasks assessed/performed Arousal/Alertness: Awake/alert Orientation Level: Appears intact for tasks assessed Behavior During Session: [facility] for tasks performed    Balance     End of Session PT - End of Session Activity Tolerance: Patient tolerated treatment well Patient left: in chair;with call bell/phone within reach Nurse Communication: Mobility status    Virgie Kunda 03/25/2012, 1:07 PM

## 2012-04-14 ENCOUNTER — Ambulatory Visit: Payer: 59 | Attending: Orthopedic Surgery | Admitting: Physical Therapy

## 2012-04-14 DIAGNOSIS — M25569 Pain in unspecified knee: Secondary | ICD-10-CM | POA: Insufficient documentation

## 2012-04-14 DIAGNOSIS — R5381 Other malaise: Secondary | ICD-10-CM | POA: Insufficient documentation

## 2012-04-14 DIAGNOSIS — M25669 Stiffness of unspecified knee, not elsewhere classified: Secondary | ICD-10-CM | POA: Insufficient documentation

## 2012-04-14 DIAGNOSIS — Z96659 Presence of unspecified artificial knee joint: Secondary | ICD-10-CM | POA: Insufficient documentation

## 2012-04-14 DIAGNOSIS — IMO0001 Reserved for inherently not codable concepts without codable children: Secondary | ICD-10-CM | POA: Insufficient documentation

## 2012-04-20 ENCOUNTER — Ambulatory Visit: Payer: 59 | Admitting: Physical Therapy

## 2012-04-22 ENCOUNTER — Ambulatory Visit: Payer: 59 | Admitting: Physical Therapy

## 2012-04-28 ENCOUNTER — Ambulatory Visit: Payer: 59 | Admitting: Physical Therapy

## 2012-05-12 ENCOUNTER — Ambulatory Visit: Payer: 59 | Admitting: Physical Therapy

## 2012-05-19 ENCOUNTER — Ambulatory Visit: Payer: 59 | Attending: Orthopedic Surgery | Admitting: Physical Therapy

## 2012-05-19 DIAGNOSIS — IMO0001 Reserved for inherently not codable concepts without codable children: Secondary | ICD-10-CM | POA: Insufficient documentation

## 2012-05-19 DIAGNOSIS — M25569 Pain in unspecified knee: Secondary | ICD-10-CM | POA: Insufficient documentation

## 2012-05-19 DIAGNOSIS — R5381 Other malaise: Secondary | ICD-10-CM | POA: Insufficient documentation

## 2012-05-19 DIAGNOSIS — M25669 Stiffness of unspecified knee, not elsewhere classified: Secondary | ICD-10-CM | POA: Insufficient documentation

## 2012-05-25 ENCOUNTER — Encounter: Payer: 59 | Admitting: Physical Therapy

## 2012-06-11 ENCOUNTER — Ambulatory Visit: Payer: 59 | Admitting: Physical Therapy

## 2012-06-23 ENCOUNTER — Ambulatory Visit: Payer: 59 | Attending: Orthopedic Surgery | Admitting: Physical Therapy

## 2012-06-23 DIAGNOSIS — R5381 Other malaise: Secondary | ICD-10-CM | POA: Insufficient documentation

## 2012-06-23 DIAGNOSIS — M25569 Pain in unspecified knee: Secondary | ICD-10-CM | POA: Insufficient documentation

## 2012-06-23 DIAGNOSIS — IMO0001 Reserved for inherently not codable concepts without codable children: Secondary | ICD-10-CM | POA: Insufficient documentation

## 2012-06-23 DIAGNOSIS — M25669 Stiffness of unspecified knee, not elsewhere classified: Secondary | ICD-10-CM | POA: Insufficient documentation

## 2012-07-02 ENCOUNTER — Ambulatory Visit (INDEPENDENT_AMBULATORY_CARE_PROVIDER_SITE_OTHER): Payer: 59 | Admitting: Sports Medicine

## 2012-07-02 VITALS — BP 118/80 | Ht 61.0 in | Wt 170.0 lb

## 2012-07-02 DIAGNOSIS — R269 Unspecified abnormalities of gait and mobility: Secondary | ICD-10-CM | POA: Insufficient documentation

## 2012-07-02 DIAGNOSIS — M722 Plantar fascial fibromatosis: Secondary | ICD-10-CM | POA: Insufficient documentation

## 2012-07-02 DIAGNOSIS — M79672 Pain in left foot: Secondary | ICD-10-CM

## 2012-07-02 DIAGNOSIS — M79609 Pain in unspecified limb: Secondary | ICD-10-CM

## 2012-07-02 DIAGNOSIS — M25569 Pain in unspecified knee: Secondary | ICD-10-CM

## 2012-07-02 DIAGNOSIS — M25562 Pain in left knee: Secondary | ICD-10-CM | POA: Insufficient documentation

## 2012-07-02 DIAGNOSIS — M2170 Unequal limb length (acquired), unspecified site: Secondary | ICD-10-CM | POA: Insufficient documentation

## 2012-07-02 NOTE — Assessment & Plan Note (Signed)
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  Added a 3/8 inch heel wedge to the right shoe

## 2012-07-02 NOTE — Patient Instructions (Addendum)
Thank you for coming in today. Wear the knee sleeve and orthotics Come back in 1 month.  Light walking to the point of pain.  Come back in about 1 month.

## 2012-07-02 NOTE — Progress Notes (Signed)
[name] is a 61 y.o. female who presents to [facility] today for  1) bilateral foot pain.  Present since April. Patient has been diagnosed with bilateral plantar fasciitis and fitted with custom rigid orthotics via podiatry.  These have helped her plantar pain. However she has developed pain on the dorsal aspect of the first metatarsal on her left foot.  This is painful even after standing and will sometimes wake her up at night with pain.  She denies any radiating pain weakness or numbness.   2) left knee pain:  History of knee fracture with knee replacement and most recently revision with poly-exchange in June of 2013.  She notes pain and swelling of the left knee sometimes with clicking and a sense of instability.  She is walking up to 1 mile a day.  Her goal is to walk more.    PMH reviewed. Significant for left knee replacement with revision History  Substance Use Topics  . Smoking status: Former Smoker    Quit date: 03/16/2005  . Smokeless tobacco: Not on file  . Alcohol Use: Yes     OCCASIONAL   ROS as above otherwise neg   Exam:  BP 118/80  Ht 5\' 1"  (1.549 m)  Wt 170 lb (77.111 kg)  BMI 32.12 kg/m2 Gen: Well NAD MSK: Left knee: Well-appearing scar. Mild effusion. Range of motion 0-120  some clicking with extension.    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 Heel valgus present.  Heel returns to midline with toe standing.   Tender over the dorsal aspect of the midshaft of the first metatarsal.  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 Heel valgus present.  Heel returns to midline with toe standing.   No tender throughout.  No significant tenderness over the plantar calcaneus  Gait analysis: Intoeing present bilaterally Bilateral midfoot pronation with heel valgus.  Mildly antalgic gait.   Leg length:  Left leg is a 1 cm longer than the right  leg  Musculoskeletal ultrasound: Left foot:  Edema present overlying the bone on the midshaft of the left first metatarsal.  Double hyperechoic line indicating healing bone cortex.  Mildly increased Doppler activity.  Plantar surface:  Plantar fascia insertion measures 0.6 cm ( normal 0.4)

## 2012-07-02 NOTE — Assessment & Plan Note (Signed)
Plan to continue leg strengthening exercises. Additionally fitted compressive knee sleeve to aid in pain and effusion control.

## 2012-07-02 NOTE — Assessment & Plan Note (Signed)
Left foot pain likely related to resolving first metatarsal stress reaction versus fracture.  Plan:  Leg length correction Cushioned insoles. With correction of heel valgus Followup in one month

## 2012-07-02 NOTE — Assessment & Plan Note (Signed)
Cushioned insoles

## 2012-07-02 NOTE — Assessment & Plan Note (Signed)
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  Bilateral medial heel correction

## 2012-07-30 ENCOUNTER — Ambulatory Visit (INDEPENDENT_AMBULATORY_CARE_PROVIDER_SITE_OTHER): Payer: 59 | Admitting: Sports Medicine

## 2012-07-30 ENCOUNTER — Encounter: Payer: Self-pay | Admitting: Sports Medicine

## 2012-07-30 ENCOUNTER — Ambulatory Visit
Admission: RE | Admit: 2012-07-30 | Discharge: 2012-07-30 | Disposition: A | Discharge status: Home / Self Care | Payer: 59 | Source: Ambulatory Visit | Attending: Sports Medicine | Admitting: Sports Medicine

## 2012-07-30 VITALS — BP 150/93 | HR 78 | Ht 61.0 in | Wt 170.0 lb

## 2012-07-30 DIAGNOSIS — M79672 Pain in left foot: Secondary | ICD-10-CM

## 2012-07-30 DIAGNOSIS — M79609 Pain in unspecified limb: Secondary | ICD-10-CM

## 2012-07-30 DIAGNOSIS — R269 Unspecified abnormalities of gait and mobility: Secondary | ICD-10-CM

## 2012-07-30 NOTE — Patient Instructions (Addendum)
Thank you for coming in today. We think you have several issues.  1) We will assess the degree of arthritis in the mid left foot with an xray.  2) You may benefit from some targeted exercises and possibly a brace from the foot drop.  3) You may benefit from the custom orthotics.   Please give this some time.

## 2012-07-30 NOTE — Assessment & Plan Note (Signed)
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

## 2012-07-30 NOTE — Assessment & Plan Note (Signed)
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  Unclear etiology at this point.  Plan:  Placed a lift on the toe portion of her sports insoles and removed the medial heel wedge.  Additionally ordered a 3 view foot x-ray to assess for significant DJD or breakdown.  Plan to followup in 4 weeks or so sooner if needed.  Patient fell improvement in her foot pain  following sports insoles correction. She may need custom orthotics or a ankle foot orthosis.

## 2012-07-30 NOTE — Progress Notes (Signed)
[name] is a 61 y.o. female who presents to [facility] today for followup left foot pain.  Patient was seen in September for left foot and knee pain.  Her left foot pain was thought to be stress reaction secondary to firm rigid orthotics.  She was found to have a significant leg length difference and was given sports insoles with leg length correction and medial wedge on heels.    Patient notes that her right foot pain improved however her left heel pain is still persistent. She was unable to tolerate the sports insoles in the left.   Upon further questioning patient notes a history of a traumatic injury to her mid foot while ice-skating which was treated with casting 30 years ago.  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  She notes difficulty with foot dorsiflexion.  At one point her life she had a ankle foot orthosis however she stopped wearing it because it is cumbersome.    She describes her dorsal left foot pain as sharp and stabbing present with and without exercise most bothersome at night.     PMH reviewed. As noted above History  Substance Use Topics  . Smoking status: Former Smoker    Quit date: 03/16/2005  . Smokeless tobacco: Not on file  . Alcohol Use: Yes     OCCASIONAL   ROS as above otherwise neg   Exam:  BP 150/93  Pulse 78  Ht 5\' 1"  (1.549 m)  Wt 170 lb (77.111 kg)  BMI 32.12 kg/m2 Gen: Well NAD MSK: Left foot.  Significant pes planus with mild ankle subluxation medially.  Range of motion is limited the foot dorsiflexion lacking 15 compared to right.   Static strength the foot dorsiflexion is intact 5/5 bilaterally. Patient can stand on her heels but has difficulty.  Sensation to the dorsum of her left foot is intact however patient has pain on palpation of the first ray at the mid foot and with foot squeeze.   Gait analysis: No severe foot drop or slap however patient does have mild Trendelenburg gait where she raises her  left knee and hip with the swing phase of her left foot.

## 2012-09-01 ENCOUNTER — Ambulatory Visit (INDEPENDENT_AMBULATORY_CARE_PROVIDER_SITE_OTHER): Payer: 59 | Admitting: Sports Medicine

## 2012-09-01 VITALS — BP 138/88 | Ht 60.0 in | Wt 170.0 lb

## 2012-09-01 DIAGNOSIS — M722 Plantar fascial fibromatosis: Secondary | ICD-10-CM

## 2012-09-01 DIAGNOSIS — M79672 Pain in left foot: Secondary | ICD-10-CM

## 2012-09-01 DIAGNOSIS — M79609 Pain in unspecified limb: Secondary | ICD-10-CM

## 2012-09-01 DIAGNOSIS — R269 Unspecified abnormalities of gait and mobility: Secondary | ICD-10-CM

## 2012-09-01 NOTE — Assessment & Plan Note (Addendum)
A: improved. No DJD of of Lisfranc joint on x-ray. No swelling on exam today.  P: continue with insoles.  Plan for her to bring other shoes for [place] to evaluate Add arch supports and place a heel lift on the left forefoot on each shoe  Gradually increase her walking as tolerated  Her rigid orthotics were so uncomfortable I don't think she will be able to use those  Recheck in 3-4 months

## 2012-09-01 NOTE — Assessment & Plan Note (Addendum)
A: improved.  P:  Continue sports insoles. Patient advised to bring in shoes from home for additional insoles.

## 2012-09-01 NOTE — Progress Notes (Signed)
Subjective:     Patient ID: [name], female   DOB: 12/23/1950, 61 y.o.   MRN: [id number]  HPI 61 yo F presents for f/u visit to discuss the following:  1. Bilateral foot pain: marked improvement. She no longer has nighttime pain. She is able to tolerate long distance walking. She is compliant with sports insoles.   2. L foot drop: improved. No tripping. Able to walk up stairs comfortably. She is using a forefoot left on her left sports insole and that helps correct her foot drop.  Review of Systems As per HPI    Objective:   Physical Exam BP 138/88  Ht 5' (1.524 m)  Wt 170 lb (77.111 kg)  BMI 33.20 kg/m2 Gen: Well NAD  MSK: Left foot. 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 No dorsal foot edema. Mild tenderness on palpation of medial plantar fascia. Mild tenderness over first metacarpal head.  Range of motion is limited the foot dorsiflexion lacking 10 compared to right. Static strength the foot dorsiflexion is intact 5/5 bilaterally.Sensation to the dorsum of her left foot is intact.   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 No dorsal foot edema. Mild tenderness on palpation of medial plantar fascia. Mild tenderness over first metacarpal head.  Range of motion is full. Static strength the foot dorsiflexion is intact 5/5 bilaterally. Sensation to the dorsum of her left foot is intact.    Gait analysis: Mild left foot drop noticed when walking barefoot.   Reviewed L foot  x-ray 07/30/12: normal x-ray. Specifically, normal alignment in the Lisfranc joint.     Assessment and Plan:

## 2012-09-01 NOTE — Assessment & Plan Note (Signed)
A: improved with shoes insoles. Patient has a significant forefoot build up on her insole in order to offset her mild foot drop.  P: recreate insoles. Patient will drop off 1-2 more pairs of shoes.

## 2013-08-03 ENCOUNTER — Ambulatory Visit (INDEPENDENT_AMBULATORY_CARE_PROVIDER_SITE_OTHER): Payer: 59 | Admitting: Sports Medicine

## 2013-08-03 ENCOUNTER — Encounter: Payer: Self-pay | Admitting: Sports Medicine

## 2013-08-03 VITALS — BP 134/99 | HR 88 | Ht 60.0 in | Wt 170.0 lb

## 2013-08-03 DIAGNOSIS — M79609 Pain in unspecified limb: Secondary | ICD-10-CM

## 2013-08-03 DIAGNOSIS — R269 Unspecified abnormalities of gait and mobility: Secondary | ICD-10-CM

## 2013-08-03 DIAGNOSIS — M79672 Pain in left foot: Secondary | ICD-10-CM

## 2013-08-03 DIAGNOSIS — M2170 Unequal limb length (acquired), unspecified site: Secondary | ICD-10-CM

## 2013-08-03 NOTE — Assessment & Plan Note (Addendum)
Patient was fitted for a : standard, cushioned, semi-rigid orthotic. The orthotic was heated and afterward the patient stood on the orthotic blank positioned on the orthotic stand. The patient was positioned in subtalar neutral position and 10 degrees of ankle dorsiflexion in a weight bearing stance. After completion of molding, a stable base was applied to the orthotic blank. The blank was ground to a stable position for weight bearing. Size: 10 red EVA Base: blue med EVA Posting: see below Additional orthotic padding: 3/4 length heel lift added to front of left orthotic so that thicker portion was underneath toes to help with foot drop, blue foam added to right orthotic for leg length discrepancy.  Pt was comfortable and walking gait neutral in custom orthotics.  Preparation time was 40 minutes  She will continue to use these over the next 3 months and if she is having difficulties will return for reevaluation. If no problems she can recheck yearly.

## 2013-08-03 NOTE — Progress Notes (Deleted)
  Subjective:    Patient ID: [name], female    DOB: 10-26-1950, 61 y.o.   MRN: [id number]  HPI  Pt presents to clinic for f/u of left medial knee pain which is 95% improved.   Review of Systems     Objective:   Physical Exam        Assessment & Plan:

## 2013-08-03 NOTE — Progress Notes (Signed)
  Subjective:    Patient ID: [name], female    DOB: 18-Jul-1951, 62 y.o.   MRN: [id number]  HPI Comments: [name] is a 62 y.o. female with a past medical history of bilateral transverse and medial arch collapse as well as left foot drop who presents with followup for bilateral foot pain secondary to her arch collapse as well as requesting customized orthotics. She states that she has no pain in her foot on most days except for when she is not wearing her tennis shoes with the current orthotics. Pain is significantly improved with the current treatment. No new or additional symptoms. No recent trauma. She is relatively active, recently walking a 5K.  Additionally, on further history, the patient admits to dependent edema and pain in her bilateral feet and legs worse in the evenings. Pain resolves with swelling and with elevation and time. She has intermittently worn compression stockings for this and has noticed significant improvement in the swelling, although she does not wear them daily. She does work on her feet all day.     Review of Systems  All other systems reviewed and are negative.       Objective:   Physical Exam  Constitutional: She appears well-developed and well-nourished. No distress.  Musculoskeletal:  Right foot: Medial arch and transverse arch collapse. Morton's foot. 1-2 cm leg length shortening compared to the left. No bony tenderness. No warmth, erythema.  Left foot: Medial arch and transverse arch collapse. Morton's foot. Dorsiflexion limited to 10 less than right foot. Tenderness to palpation of the plantar fascia. Slight left foot drop.  Gait: Slight left foot drop when walking without shoes. Slight compensation with high step using hip.  Skin: She is not diaphoretic.          Assessment & Plan:   62 year old female with bilateral longitudinal and transverse arch collapse as well as left foot drop.  #1. Bilateral longitudinal and transverse  arch collapse -Good improvement with temporary orthotics. Custom orthotics made at this visit. Patient is to keep the old orthotics for additional pairs of shoes.

## 2013-08-03 NOTE — Assessment & Plan Note (Signed)
Left foot pain is controlled with orthotics  Today we will make a custom pair  Doubt any significant osteoarthritis at this point

## 2013-08-03 NOTE — Assessment & Plan Note (Signed)
Limited correction made on her orthotic but it does seem to control her gait

## 2014-02-24 ENCOUNTER — Encounter: Payer: Self-pay | Admitting: Sports Medicine

## 2014-02-24 ENCOUNTER — Ambulatory Visit (INDEPENDENT_AMBULATORY_CARE_PROVIDER_SITE_OTHER): Payer: 59 | Admitting: Sports Medicine

## 2014-02-24 VITALS — BP 137/74 | HR 78 | Ht 60.0 in | Wt 179.0 lb

## 2014-02-24 DIAGNOSIS — M79675 Pain in left toe(s): Secondary | ICD-10-CM | POA: Insufficient documentation

## 2014-02-24 DIAGNOSIS — M79609 Pain in unspecified limb: Secondary | ICD-10-CM

## 2014-02-24 NOTE — Assessment & Plan Note (Signed)
Patient given a hammer pad as well as placed in a metatarsal pad to try and unload the region. We discussed appropriate foot wear to try and avoid contact. She'll followup as needed for this.

## 2014-02-24 NOTE — Progress Notes (Signed)
Patient ID: [name], female   DOB: [birth year], 63 y.o.   MRN: [id number] 63 year old female with a complaint of 2 weeks of pain at the tip of the left second toe. Denies any specific injury. She has been taping the toe upright to try and prevent contact with the shoe. This seems to alleviate her symptoms somewhat. She has a history of a foot drop in his foot. No medication to alleviate the symptoms. Denies any significant swelling. No history of prior episodes of this pain.  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  Leg length discrepancy and plantar fasciitis  She'll history: Former smoker, and occasional alcohol  Review of systems as per history of present illness otherwise negative  Examination: BP 137/74  Pulse 78  Ht 5' (1.524 m)  Wt 179 lb (81.194 kg)  BMI 34.96 kg/m2 Well-developed well-nourished 63 year old white female awake alert and oriented no acute distress. Feet: Midray dominant with bilateral Morton's feet Tenderness to palpation the tip of the second digit on the left foot. Collapse of the transverse arch bilaterally with minimal to no contact with ambulation  Musculoskeletal ultrasound of the second toe on the left foot from the dorsal surface reveal an intact tendon, there is evidence of bony spurring in the distal phalanx with a 30 changes extending into the DIP joint.

## 2014-03-08 ENCOUNTER — Telehealth: Payer: Self-pay | Admitting: *Deleted

## 2014-03-08 NOTE — Telephone Encounter (Signed)
DR HEWITT Monday July 20TH 930AM [name] [address] [place] [phone]

## 2014-04-20 ENCOUNTER — Other Ambulatory Visit: Payer: Self-pay | Admitting: Orthopedic Surgery

## 2014-04-20 DIAGNOSIS — Z4789 Encounter for other orthopedic aftercare: Secondary | ICD-10-CM

## 2014-06-22 ENCOUNTER — Other Ambulatory Visit (HOSPITAL_COMMUNITY): Payer: Self-pay | Admitting: Family Medicine

## 2014-06-22 DIAGNOSIS — Z1231 Encounter for screening mammogram for malignant neoplasm of breast: Secondary | ICD-10-CM

## 2014-07-07 ENCOUNTER — Ambulatory Visit (HOSPITAL_COMMUNITY)
Admission: RE | Admit: 2014-07-07 | Discharge: 2014-07-07 | Disposition: A | Discharge status: Home / Self Care | Payer: 59 | Source: Ambulatory Visit | Attending: Family Medicine | Admitting: Family Medicine

## 2014-07-07 DIAGNOSIS — Z1231 Encounter for screening mammogram for malignant neoplasm of breast: Secondary | ICD-10-CM

## 2015-07-12 ENCOUNTER — Other Ambulatory Visit: Payer: Self-pay | Admitting: Family Medicine

## 2015-07-12 DIAGNOSIS — Z1231 Encounter for screening mammogram for malignant neoplasm of breast: Secondary | ICD-10-CM

## 2015-08-02 ENCOUNTER — Ambulatory Visit: Payer: Self-pay

## 2015-08-09 ENCOUNTER — Ambulatory Visit (INDEPENDENT_AMBULATORY_CARE_PROVIDER_SITE_OTHER): Payer: 59

## 2015-08-09 DIAGNOSIS — Z1231 Encounter for screening mammogram for malignant neoplasm of breast: Secondary | ICD-10-CM

## 2016-07-22 ENCOUNTER — Other Ambulatory Visit: Payer: Self-pay | Admitting: Family Medicine

## 2016-07-22 DIAGNOSIS — Z1231 Encounter for screening mammogram for malignant neoplasm of breast: Secondary | ICD-10-CM

## 2016-08-20 ENCOUNTER — Ambulatory Visit (INDEPENDENT_AMBULATORY_CARE_PROVIDER_SITE_OTHER): Payer: 59

## 2016-08-20 DIAGNOSIS — Z1231 Encounter for screening mammogram for malignant neoplasm of breast: Secondary | ICD-10-CM | POA: Diagnosis not present

## 2017-08-13 ENCOUNTER — Other Ambulatory Visit: Payer: Self-pay | Admitting: Family Medicine

## 2017-08-13 DIAGNOSIS — Z1239 Encounter for other screening for malignant neoplasm of breast: Secondary | ICD-10-CM

## 2017-08-20 ENCOUNTER — Ambulatory Visit (INDEPENDENT_AMBULATORY_CARE_PROVIDER_SITE_OTHER): Payer: 59

## 2017-08-20 DIAGNOSIS — Z1231 Encounter for screening mammogram for malignant neoplasm of breast: Secondary | ICD-10-CM

## 2017-08-20 DIAGNOSIS — Z1239 Encounter for other screening for malignant neoplasm of breast: Secondary | ICD-10-CM

## 2019-08-09 ENCOUNTER — Other Ambulatory Visit: Payer: Self-pay | Admitting: Family Medicine

## 2019-08-09 DIAGNOSIS — Z1231 Encounter for screening mammogram for malignant neoplasm of breast: Secondary | ICD-10-CM

## 2019-09-15 ENCOUNTER — Ambulatory Visit (INDEPENDENT_AMBULATORY_CARE_PROVIDER_SITE_OTHER): Payer: Medicare Other

## 2019-09-15 ENCOUNTER — Other Ambulatory Visit: Payer: Self-pay

## 2019-09-15 DIAGNOSIS — Z1231 Encounter for screening mammogram for malignant neoplasm of breast: Secondary | ICD-10-CM | POA: Diagnosis not present

## 2020-01-04 ENCOUNTER — Ambulatory Visit: Payer: Medicare Other | Attending: Internal Medicine

## 2020-01-04 DIAGNOSIS — Z23 Encounter for immunization: Secondary | ICD-10-CM

## 2020-01-04 NOTE — Progress Notes (Signed)
   Covid-19 Vaccination Clinic  Name:  [name]    MRN: [id number] DOB: 1951/02/17  01/04/2020  [name] was observed post Covid-19 immunization for 15 minutes without incident. She was provided with Vaccine Information Sheet and instruction to access the V-Safe system.   [name] was instructed to call 911 with any severe reactions post vaccine: Marland Kitchen Difficulty breathing  . Swelling of face and throat  . A fast heartbeat  . A bad rash all over body  . Dizziness and weakness   Immunizations Administered    Name Date Dose VIS Date Route   Pfizer COVID-19 Vaccine 01/04/2020  9:11 AM 0.3 mL 09/24/2019 Intramuscular   Manufacturer: [place]   Lot: [id number]   [place]: [id number]

## 2020-01-26 ENCOUNTER — Ambulatory Visit: Payer: Medicare Other | Attending: Internal Medicine

## 2020-01-26 DIAGNOSIS — Z23 Encounter for immunization: Secondary | ICD-10-CM

## 2020-01-26 NOTE — Progress Notes (Signed)
   Covid-19 Vaccination Clinic  Name:  [name]    MRN: [id number] DOB: 27-Jul-1951  01/26/2020  [name] was observed post Covid-19 immunization for 15 minutes without incident. She was provided with Vaccine Information Sheet and instruction to access the V-Safe system.   [name] was instructed to call 911 with any severe reactions post vaccine: Marland Kitchen Difficulty breathing  . Swelling of face and throat  . A fast heartbeat  . A bad rash all over body  . Dizziness and weakness   Immunizations Administered    Name Date Dose VIS Date Route   Pfizer COVID-19 Vaccine 01/26/2020 11:48 AM 0.3 mL 09/24/2019 Intramuscular   Manufacturer: [place]   Lot: [id number]   [place]: [id number]

## 2020-08-07 ENCOUNTER — Other Ambulatory Visit: Payer: Self-pay | Admitting: Family Medicine

## 2020-08-07 DIAGNOSIS — Z1231 Encounter for screening mammogram for malignant neoplasm of breast: Secondary | ICD-10-CM

## 2020-09-27 ENCOUNTER — Ambulatory Visit (INDEPENDENT_AMBULATORY_CARE_PROVIDER_SITE_OTHER): Payer: Medicare Other

## 2020-09-27 ENCOUNTER — Other Ambulatory Visit: Payer: Self-pay

## 2020-09-27 DIAGNOSIS — Z1231 Encounter for screening mammogram for malignant neoplasm of breast: Secondary | ICD-10-CM | POA: Diagnosis not present

## 2021-08-24 ENCOUNTER — Other Ambulatory Visit: Payer: Self-pay | Admitting: Family Medicine

## 2021-08-24 DIAGNOSIS — Z1231 Encounter for screening mammogram for malignant neoplasm of breast: Secondary | ICD-10-CM

## 2021-10-03 ENCOUNTER — Other Ambulatory Visit: Payer: Self-pay

## 2021-10-03 ENCOUNTER — Ambulatory Visit (INDEPENDENT_AMBULATORY_CARE_PROVIDER_SITE_OTHER): Payer: Medicare Other

## 2021-10-03 DIAGNOSIS — Z1231 Encounter for screening mammogram for malignant neoplasm of breast: Secondary | ICD-10-CM

## 2022-06-07 ENCOUNTER — Encounter (HOSPITAL_COMMUNITY): Payer: Self-pay | Admitting: Orthopedic Surgery

## 2022-06-07 ENCOUNTER — Other Ambulatory Visit: Payer: Self-pay

## 2022-06-11 ENCOUNTER — Ambulatory Visit (HOSPITAL_COMMUNITY): Payer: Medicare Other | Admitting: Certified Registered"

## 2022-06-11 ENCOUNTER — Encounter (HOSPITAL_COMMUNITY): Payer: Self-pay | Admitting: Orthopedic Surgery

## 2022-06-11 ENCOUNTER — Ambulatory Visit (HOSPITAL_COMMUNITY)
Admission: RE | Admit: 2022-06-11 | Discharge: 2022-06-11 | Disposition: A | Discharge status: Home / Self Care | Payer: Medicare Other | Source: Ambulatory Visit | Attending: Orthopedic Surgery | Admitting: Orthopedic Surgery

## 2022-06-11 ENCOUNTER — Encounter (HOSPITAL_COMMUNITY)
Admission: RE | Disposition: A | Discharge status: Home / Self Care | Payer: Self-pay | Source: Ambulatory Visit | Attending: Orthopedic Surgery

## 2022-06-11 ENCOUNTER — Ambulatory Visit (HOSPITAL_BASED_OUTPATIENT_CLINIC_OR_DEPARTMENT_OTHER): Payer: Medicare Other | Admitting: Certified Registered"

## 2022-06-11 DIAGNOSIS — G8929 Other chronic pain: Secondary | ICD-10-CM | POA: Diagnosis not present

## 2022-06-11 DIAGNOSIS — M6588 Other synovitis and tenosynovitis, other site: Secondary | ICD-10-CM | POA: Diagnosis not present

## 2022-06-11 DIAGNOSIS — Z87891 Personal history of nicotine dependence: Secondary | ICD-10-CM | POA: Diagnosis not present

## 2022-06-11 DIAGNOSIS — M8788 Other osteonecrosis, other site: Secondary | ICD-10-CM | POA: Insufficient documentation

## 2022-06-11 DIAGNOSIS — M1812 Unilateral primary osteoarthritis of first carpometacarpal joint, left hand: Secondary | ICD-10-CM

## 2022-06-11 DIAGNOSIS — M659 Synovitis and tenosynovitis, unspecified: Secondary | ICD-10-CM | POA: Diagnosis not present

## 2022-06-11 DIAGNOSIS — M87838 Other osteonecrosis of left carpus: Secondary | ICD-10-CM

## 2022-06-11 DIAGNOSIS — I10 Essential (primary) hypertension: Secondary | ICD-10-CM | POA: Insufficient documentation

## 2022-06-11 DIAGNOSIS — Z79899 Other long term (current) drug therapy: Secondary | ICD-10-CM | POA: Diagnosis not present

## 2022-06-11 HISTORY — PX: CARPOMETACARPEL SUSPENSION PLASTY: SHX5005

## 2022-06-11 HISTORY — DX: Cardiac murmur, unspecified: R01.1

## 2022-06-11 HISTORY — DX: Essential (primary) hypertension: I10

## 2022-06-11 SURGERY — CARPOMETACARPEL (CMC) SUSPENSION PLASTY
Anesthesia: Monitor Anesthesia Care | Laterality: Left

## 2022-06-11 MED ORDER — AMISULPRIDE (ANTIEMETIC) 5 MG/2ML IV SOLN
INTRAVENOUS | Status: AC
  Filled 2022-06-11: qty 4

## 2022-06-11 MED ORDER — PROPOFOL 500 MG/50ML IV EMUL
INTRAVENOUS | Status: DC | PRN
  Administered 2022-06-11: 100 ug/kg/min via INTRAVENOUS

## 2022-06-11 MED ORDER — CHLORHEXIDINE GLUCONATE 0.12 % MT SOLN: 15.0000 mL | Freq: Once | OROMUCOSAL | Status: AC

## 2022-06-11 MED ORDER — AMISULPRIDE (ANTIEMETIC) 5 MG/2ML IV SOLN
10.0000 mg | Freq: Once | INTRAVENOUS | Status: AC | PRN
  Administered 2022-06-11: 10 mg via INTRAVENOUS

## 2022-06-11 MED ORDER — PROPOFOL 10 MG/ML IV BOLUS
INTRAVENOUS | Status: DC | PRN
  Administered 2022-06-11: 40 mg via INTRAVENOUS
  Administered 2022-06-11: 50 mg via INTRAVENOUS

## 2022-06-11 MED ORDER — ORAL CARE MOUTH RINSE
15.0000 mL | Freq: Once | OROMUCOSAL | Status: AC
Start: 2022-06-11 — End: 2022-06-11

## 2022-06-11 MED ORDER — FENTANYL CITRATE (PF) 100 MCG/2ML IJ SOLN: 25.0000 ug | INTRAMUSCULAR | Status: DC | PRN

## 2022-06-11 MED ORDER — ONDANSETRON HCL 4 MG/2ML IJ SOLN
INTRAMUSCULAR | Status: AC
  Filled 2022-06-11: qty 2

## 2022-06-11 MED ORDER — PHENYLEPHRINE HCL-NACL 20-0.9 MG/250ML-% IV SOLN
INTRAVENOUS | Status: DC | PRN
  Administered 2022-06-11: 10 ug/min via INTRAVENOUS

## 2022-06-11 MED ORDER — LACTATED RINGERS IV SOLN: INTRAVENOUS | Status: DC

## 2022-06-11 MED ORDER — CHLORHEXIDINE GLUCONATE 0.12 % MT SOLN
OROMUCOSAL | Status: AC
  Administered 2022-06-11: 15 mL via OROMUCOSAL
  Filled 2022-06-11: qty 15

## 2022-06-11 MED ORDER — FENTANYL CITRATE (PF) 100 MCG/2ML IJ SOLN: 50.0000 ug | Freq: Once | INTRAMUSCULAR | Status: AC

## 2022-06-11 MED ORDER — ROPIVACAINE HCL 5 MG/ML IJ SOLN
INTRAMUSCULAR | Status: DC | PRN
  Administered 2022-06-11: 30 mL via PERINEURAL

## 2022-06-11 MED ORDER — ROCURONIUM BROMIDE 10 MG/ML (PF) SYRINGE
PREFILLED_SYRINGE | INTRAVENOUS | Status: AC
  Filled 2022-06-11: qty 10

## 2022-06-11 MED ORDER — PHENYLEPHRINE 80 MCG/ML (10ML) SYRINGE FOR IV PUSH (FOR BLOOD PRESSURE SUPPORT)
PREFILLED_SYRINGE | INTRAVENOUS | Status: DC | PRN
  Administered 2022-06-11: 80 ug via INTRAVENOUS

## 2022-06-11 MED ORDER — 0.9 % SODIUM CHLORIDE (POUR BTL) OPTIME
TOPICAL | Status: DC | PRN
  Administered 2022-06-11: 1000 mL

## 2022-06-11 MED ORDER — MIDAZOLAM HCL 2 MG/2ML IJ SOLN: 2.0000 mg | Freq: Once | INTRAMUSCULAR | Status: AC

## 2022-06-11 MED ORDER — CEFAZOLIN SODIUM-DEXTROSE 2-4 GM/100ML-% IV SOLN
2.0000 g | INTRAVENOUS | Status: AC
  Administered 2022-06-11: 2 g via INTRAVENOUS
  Filled 2022-06-11: qty 100

## 2022-06-11 MED ORDER — CLONIDINE HCL (ANALGESIA) 100 MCG/ML EP SOLN
EPIDURAL | Status: DC | PRN
  Administered 2022-06-11: 50 ug

## 2022-06-11 MED ORDER — SUCCINYLCHOLINE CHLORIDE 200 MG/10ML IV SOSY
PREFILLED_SYRINGE | INTRAVENOUS | Status: AC
Start: 2022-06-11 — End: ?
  Filled 2022-06-11: qty 10

## 2022-06-11 MED ORDER — LIDOCAINE 2% (20 MG/ML) 5 ML SYRINGE
INTRAMUSCULAR | Status: AC
  Filled 2022-06-11: qty 5

## 2022-06-11 MED ORDER — ACETAMINOPHEN 500 MG PO TABS
ORAL_TABLET | ORAL | Status: AC
  Administered 2022-06-11: 1000 mg
  Filled 2022-06-11: qty 2

## 2022-06-11 MED ORDER — PROPOFOL 10 MG/ML IV BOLUS
INTRAVENOUS | Status: AC
  Filled 2022-06-11: qty 20

## 2022-06-11 MED ORDER — DEXAMETHASONE SODIUM PHOSPHATE 10 MG/ML IJ SOLN
INTRAMUSCULAR | Status: AC
  Filled 2022-06-11: qty 1

## 2022-06-11 MED ORDER — LIDOCAINE 2% (20 MG/ML) 5 ML SYRINGE
INTRAMUSCULAR | Status: DC | PRN
  Administered 2022-06-11: 40 mg via INTRAVENOUS

## 2022-06-11 MED ORDER — MIDAZOLAM HCL 2 MG/2ML IJ SOLN
INTRAMUSCULAR | Status: AC
  Administered 2022-06-11: 2 mg via INTRAVENOUS
  Filled 2022-06-11: qty 2

## 2022-06-11 MED ORDER — FENTANYL CITRATE (PF) 100 MCG/2ML IJ SOLN
INTRAMUSCULAR | Status: AC
  Administered 2022-06-11: 50 ug via INTRAVENOUS
  Filled 2022-06-11: qty 2

## 2022-06-11 SURGICAL SUPPLY — 52 items
ANCHOR FIBERLOCK SUSPENSION (Anchor) IMPLANT
BAG COUNTER SPONGE SURGICOUNT (BAG) ×1 IMPLANT
BNDG ELASTIC 3X5.8 VLCR STR LF (GAUZE/BANDAGES/DRESSINGS) ×1 IMPLANT
BNDG ELASTIC 4X5.8 VLCR STR LF (GAUZE/BANDAGES/DRESSINGS) IMPLANT
BNDG ESMARK 4X9 LF (GAUZE/BANDAGES/DRESSINGS) ×1 IMPLANT
BNDG GAUZE DERMACEA FLUFF 4 (GAUZE/BANDAGES/DRESSINGS) ×3 IMPLANT
BNDG GAUZE ELAST 4 BULKY (GAUZE/BANDAGES/DRESSINGS) IMPLANT
CORD BIPOLAR FORCEPS 12FT (ELECTRODE) ×1 IMPLANT
COVER SURGICAL LIGHT HANDLE (MISCELLANEOUS) ×1 IMPLANT
CUFF TOURN SGL QUICK 18X4 (TOURNIQUET CUFF) ×1 IMPLANT
DRAPE OEC MINIVIEW 54X84 (DRAPES) IMPLANT
DRSG ADAPTIC 3X8 NADH LF (GAUZE/BANDAGES/DRESSINGS) IMPLANT
DRSG EMULSION OIL 3X3 NADH (GAUZE/BANDAGES/DRESSINGS) ×1 IMPLANT
FIBERLOOP 2 0 (SUTURE) IMPLANT
GAUZE SPONGE 4X4 12PLY STRL (GAUZE/BANDAGES/DRESSINGS) ×1 IMPLANT
GAUZE XEROFORM 5X9 LF (GAUZE/BANDAGES/DRESSINGS) IMPLANT
GLOVE BIOGEL M 8.0 STRL (GLOVE) ×1 IMPLANT
GLOVE SS BIOGEL STRL SZ 8 (GLOVE) ×2 IMPLANT
GLOVE SUPERSENSE BIOGEL SZ 8 (GLOVE) ×2
GOWN STRL REUS W/ TWL LRG LVL3 (GOWN DISPOSABLE) ×3 IMPLANT
GOWN STRL REUS W/ TWL XL LVL3 (GOWN DISPOSABLE) ×2 IMPLANT
GOWN STRL REUS W/TWL LRG LVL3 (GOWN DISPOSABLE) ×3
GOWN STRL REUS W/TWL XL LVL3 (GOWN DISPOSABLE) ×2
KIT BASIN OR (CUSTOM PROCEDURE TRAY) ×1 IMPLANT
KIT TURNOVER KIT B (KITS) ×1 IMPLANT
NDL HYPO 25GX1X1/2 BEV (NEEDLE) ×1 IMPLANT
NEEDLE HYPO 25GX1X1/2 BEV (NEEDLE) ×1 IMPLANT
NS IRRIG 1000ML POUR BTL (IV SOLUTION) ×1 IMPLANT
PACK ORTHO EXTREMITY (CUSTOM PROCEDURE TRAY) ×1 IMPLANT
PAD ARMBOARD 7.5X6 YLW CONV (MISCELLANEOUS) ×2 IMPLANT
PAD CAST 3X4 CTTN HI CHSV (CAST SUPPLIES) ×1 IMPLANT
PAD CAST 4YDX4 CTTN HI CHSV (CAST SUPPLIES) IMPLANT
PADDING CAST COTTON 3X4 STRL (CAST SUPPLIES) ×1
PADDING CAST COTTON 4X4 STRL (CAST SUPPLIES) ×1
PADDING CAST COTTON 6X4 STRL (CAST SUPPLIES) IMPLANT
SOL PREP POV-IOD 4OZ 10% (MISCELLANEOUS) ×2 IMPLANT
SPLINT FIBERGLASS 3X12 (CAST SUPPLIES) IMPLANT
SPLINT FIBERGLASS 3X35 (CAST SUPPLIES) IMPLANT
SUCTION FRAZIER HANDLE 10FR (MISCELLANEOUS) ×1
SUCTION TUBE FRAZIER 10FR DISP (MISCELLANEOUS) IMPLANT
SUT FIBERWIRE 2-0 18 17.9 3/8 (SUTURE) ×1
SUT FIBERWIRE 3-0 18 DIAM 3/8 (SUTURE) ×1
SUT FIBERWIRE 4-0 18 TAPR NDL (SUTURE) ×1
SUT PROLENE 4 0 PS 2 18 (SUTURE) ×1 IMPLANT
SUTURE FIBERWR 2-0 18 17.9 3/8 (SUTURE) IMPLANT
SUTURE FIBERWR 3-0 18 DIAM 3/8 (SUTURE) IMPLANT
SUTURE FIBERWR 4-0 18 TAPR NDL (SUTURE) IMPLANT
SYR CONTROL 10ML LL (SYRINGE) ×1 IMPLANT
TOWEL GREEN STERILE (TOWEL DISPOSABLE) ×1 IMPLANT
TOWEL GREEN STERILE FF (TOWEL DISPOSABLE) ×1 IMPLANT
UNDERPAD 30X36 HEAVY ABSORB (UNDERPADS AND DIAPERS) ×1 IMPLANT
WATER STERILE IRR 1000ML POUR (IV SOLUTION) ×1 IMPLANT

## 2022-06-11 NOTE — Transfer of Care (Signed)
Immediate Anesthesia Transfer of Care Note  Patient: [name]  Procedure(s) Performed: Left wrist proximal row carpectomy with extensor pollicis longus transfer and posterior interosseous nerve neurectomy. Left thumb basilar joint arthroplasty with tendon transfer and repair/suspension as necessary. (Left)  Patient Location: PACU  Anesthesia Type:MAC combined with regional for post-op pain  Level of Consciousness: awake, alert  and oriented  Airway & Oxygen Therapy: Patient Spontanous Breathing  Post-op Assessment: Report given to RN and Post -op Vital signs reviewed and stable  Post vital signs: Reviewed and stable  Last Vitals:  Vitals Value Taken Time  BP    Temp    Pulse    Resp    SpO2      Last Pain:  Vitals:   06/11/22 1341  TempSrc:   PainSc: 3       Patients Stated Pain Goal: 1 (03/00/92 3300)  Complications: No notable events documented.

## 2022-06-11 NOTE — Op Note (Signed)
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  Postop diagnosis the same  Procedure #1 proximal row carpectomy secondary to avascular necrosis and degenerative change left wrist #2 posterior interosseous nerve neurectomy left wrist #3 EPL decompression and anterior transposition left wrist #4 stress radiography left wrist #5 CMC arthroplasty (removal of the trapezium at the basilar thumb joint with fiber lock CMC arthroplasty from Arthrex).  #6 Weilby tendon transfer/APL digastric portion tendon transfer to the FCR and APL proper with multiple figure-of-eight throws.  This was a tendon transfer at the wrist level/extensor tendon and flexor tendon transfer #7 abductor pollicis longus tenodesis/shortening of her wrist extensor at the wrist forearm level to prevent dorsal lateral escape  Surgeon Roseanne Kaufman  Anesthesia Block with IV sedation  Tourniquet time less than 90 minutes  Estimated blood loss less than 30 cc  Description of procedure: The patient was taken to the operative theater and underwent a smooth induction of IV sedation she was prepped with Hibiclens scrub followed by 10-minute surgical Betadine scrub and paint followed by timeout being observed and SCDs placed.  Timeout was secured as mentioned above and the operation commenced with a dorsal incision about the wrist under sterile control.  Preoperative antibiotics were given.  A 2-1/2 inch incision was made dorsally.  Skin flaps were elevated.  The extensor retinaculum was Z lengthened.  The EPL was decompressed and transposed to the dorsal soft tissue.  This was a EPL tendon decompression and anterior transposition.  The patient tolerated this well.  Following transposition the patient then  underwent identification of the fourth compartment.  It was carefully swept ulnarly.  An interval between the second and fourth compartment was made.  The posterior interosseous nerve underwent a crushing and cauterization technique without difficulty.  Following this we then entered the wrist joint.  The scaphoid was diseased fragmented and avascular.  It was removed taking care to preserve the radioscaphocapitate ligament.  Lunate and then triquetrum were removed.  This was a proximal carpectomy.  I preserve the volar floor and imbricated with FiberWire suture the dorsal incision.  This was checked under x-ray for adequate position.  5 view radiographic series and fluoroscopy was accomplished to ensure good position.  I was pleased with this.  I then irrigated copiously and closed the Z-lengthening retinaculum over the fourth dorsal compartment leaving the EPL transposed dorsally in the anterior soft tissue.  This completed the [facility], PIN neurectomy, EPL transfer, and stress radiography.  I had a brief different tourniquet deflation time during the closure followed by reinflation and addressing the [facility] joint.  The [facility] joint had a incision made.  She has some prior scar tissue in this region.  Interval between the EPB and APL was accomplished.  The patient then underwent careful dissection of the radial artery and following this we isolated the trapezium and remove this.  Once this was complete we then took x-rays verifying the trapezium excision.  I was pleased with this and the finding this completed the arthroplasty portion of the procedure with trapeziectomy.  Following this we placed a fiber lock suture in the base of the second metacarpal according to standard technique this engages nicely.  We then brought this over and placed this through a transverse lock/3.5 swivel lock under appropriate tension.  This served as a suspension arthroplasty.  Following this a harvested portion of the APL digastric  portion was taken through a counterincision which was later closed and swept around the FCR and APL proper and multiple figure-of-eight fashion.  This completed the tendon transfer of the APL digastric portion to the APL proper and FCR.  This allowed for excellent stability and suspension as well.  I was pleased with this.  Following this I then performed a tenodesis of the APL proper this was a shortening of her wrist extensor at the wrist forearm level the patient tolerated this well.  Irrigation was applied.  Superficial radial nerve is Intact and carefully protected at all times.  The wound was imbricated about the capsule with FiberWire and ultimately skin closed with tourniquet deflated and hemostasis excellent.  This completed the [facility] arthroplasty with tendon transfer fiber lock suspension and the APL tenodesis with APL digastric portion tendon transfer.  I was quite pleased with this.  She had excellent refill soft compartments.  She will be discharged home on standard postop algorithm.  They have my cellular phone for any problems.  Should problems arise I will be immediately available.  It was a pleasure to see them today and participate in the patient's care plan.  I discussed all issues with her husband Elta Guadeloupe

## 2022-06-11 NOTE — Anesthesia Procedure Notes (Signed)
Anesthesia Regional Block: Axillary brachial plexus block   Pre-Anesthetic Checklist: , timeout performed,  Correct Patient, Correct Site, Correct Laterality,  Correct Procedure, Correct Position, site marked,  Risks and benefits discussed,  Surgical consent,  Pre-op evaluation,  At surgeon's request and post-op pain management  Laterality: Left  Prep: chloraprep       Needles:  Injection technique: Single-shot  Needle Type: Echogenic Stimulator Needle     Needle Length: 9cm  Needle Gauge: 21     Additional Needles:   Procedures:, nerve stimulator,,, ultrasound used (permanent image in chart),,     Nerve Stimulator or Paresthesia:  Response: MC, Median, ulnar, radial, 0.5 mA  Additional Responses:   Narrative:  Start time: 06/11/2022 3:17 PM End time: 06/11/2022 3:24 PM Injection made incrementally with aspirations every 5 mL.  Performed by: Personally  Anesthesiologist: Suzette Battiest, MD

## 2022-06-11 NOTE — Discharge Instructions (Signed)
We will call for your follow-up to be seen in 14 days.  Please make sure to elevate move and massage her fingers.  Please call for any problems.  If you have any emergencies call Dr. Amedeo Plenty on his cell phone at [phone]  We recommend that you to take vitamin C 1000 mg a day to promote healing. We also recommend that if you require  pain medicine that you take a stool softener to prevent constipation as most pain medicines will have constipation side effects. We recommend either Peri-Colace or Senokot and recommend that you also consider adding MiraLAX as well to prevent the constipation affects from pain medicine if you are required to use them. These medicines are over the counter and may be purchased at a local pharmacy. A cup of yogurt and a probiotic can also be helpful during the recovery process as the medicines can disrupt your intestinal environment. Keep bandage clean and dry.  Call for any problems.  No smoking.  Criteria for driving a car: you should be off your pain medicine for 7-8 hours, able to drive one handed(confident), thinking clearly and feeling able in your judgement to drive. Continue elevation as it will decrease swelling.  If instructed by MD move your fingers within the confines of the bandage/splint.  Use ice if instructed by your MD. Call immediately for any sudden loss of feeling in your hand/arm or change in functional abilities of the extremity.

## 2022-06-11 NOTE — Anesthesia Preprocedure Evaluation (Signed)
Anesthesia Evaluation  Patient identified by MRN, date of birth, ID band Patient awake    Reviewed: Allergy & Precautions, NPO status , Patient's Chart, lab work & pertinent test results  Airway Mallampati: II  TM Distance: >3 FB Neck ROM: Full    Dental  (+) Dental Advisory Given   Pulmonary former smoker,    breath sounds clear to auscultation       Cardiovascular hypertension, Pt. on medications  Rhythm:Regular Rate:Normal     Neuro/Psych negative neurological ROS     GI/Hepatic negative GI ROS, Neg liver ROS,   Endo/Other  negative endocrine ROS  Renal/GU negative Renal ROS     Musculoskeletal  (+) Arthritis ,   Abdominal   Peds  Hematology negative hematology ROS (+)   Anesthesia Other Findings   Reproductive/Obstetrics                             Anesthesia Physical Anesthesia Plan  ASA: 2  Anesthesia Plan: Regional and MAC   Post-op Pain Management: Regional block* and Tylenol PO (pre-op)*   Induction:   PONV Risk Score and Plan: 2 and Propofol infusion, Ondansetron, Dexamethasone and Treatment may vary due to age or medical condition  Airway Management Planned: Natural Airway and Simple Face Mask  Additional Equipment:   Intra-op Plan:   Post-operative Plan:   Informed Consent: I have reviewed the patients History and Physical, chart, labs and discussed the procedure including the risks, benefits and alternatives for the proposed anesthesia with the patient or authorized representative who has indicated his/her understanding and acceptance.     Dental advisory given  Plan Discussed with: CRNA  Anesthesia Plan Comments:         Anesthesia Quick Evaluation

## 2022-06-11 NOTE — Anesthesia Procedure Notes (Signed)
Procedure Name: MAC Date/Time: 06/11/2022 4:55 PM  Performed by: Lorie Phenix, CRNAPre-anesthesia Checklist: Patient identified, Emergency Drugs available, Suction available and Patient being monitored Patient Re-evaluated:Patient Re-evaluated prior to induction Oxygen Delivery Method: Simple face mask Placement Confirmation: positive ETCO2

## 2022-06-11 NOTE — H&P (Signed)
[name] is an 71 y.o. female.   Chief Complaint: The patient presents for surgical reconstruction left wrist and thumb. HPI: Patient presents for evaluation and treatment of the of their upper extremity predicament. The patient denies neck, back, chest or  abdominal pain. The patient notes that they have no lower extremity problems. The patients primary complaint is noted. We are planning surgical care pathway for the upper extremity.   Past Medical History:  Diagnosis Date   Arthritis    Cancer ([place])    Hx skin cancer   Complication of anesthesia    ITCHING   Heart murmur    History of shingles    X2   Hypertension    Nocturia    Plantar fascia rupture     Past Surgical History:  Procedure Laterality Date   carpal tunnell     BIL   HAMMER TOE SURGERY Left    I & D KNEE WITH POLY EXCHANGE  03/23/2012   Procedure: IRRIGATION AND DEBRIDEMENT KNEE WITH POLY EXCHANGE;  Surgeon: Mauri Pole, MD;  Location: [facility];  Service: Orthopedics;  Laterality: Left;  debridement scar tissue   JOINT REPLACEMENT  10/15/2003   Lt  ttotal knee   KNEE ARTHROSCOPY     LEFT   THUMB SURG Bilateral    TIBIA FRACTURE SURGERY     WITH PLATE   TONSILLECTOMY     TRIGGER THUMB      TUBAL LIGATION      History reviewed. No pertinent family history. Social History:  reports that she quit smoking about 17 years ago. Her smoking use included cigarettes. She does not have any smokeless tobacco history on file. She reports current alcohol use. She reports that she does not use drugs.  Allergies:  Allergies  Allergen Reactions   Codeine     Vertigo, upset stomach    Medications Prior to Admission  Medication Sig Dispense Refill   acetaminophen (TYLENOL) 500 MG tablet Take 500 mg by mouth every 6 (six) hours as needed for moderate pain. For pain     amoxicillin (AMOXIL) 500 MG capsule Take 2,000 mg by mouth See admin instructions. Take 4 capsules by mouth before dental procedures      hydrocortisone (ANUSOL-HC) 2.5 % rectal cream Place 1 Application rectally 4 (four) times daily as needed for hemorrhoids.     MAGNESIUM PO Take 1 tablet by mouth daily as needed (cramps).     Multiple Vitamin (MULITIVITAMIN WITH MINERALS) TABS Take 1 tablet by mouth daily.     Potassium (POTASSIMIN PO) Take 1 tablet by mouth daily as needed (cramps).      No results found for this or any previous visit (from the past 48 hour(s)). No results found.  Review of Systems  Cardiovascular: Negative.   Gastrointestinal: Negative.   Endocrine: Negative.   Genitourinary: Negative.     Blood pressure (!) 174/90, pulse 82, temperature 98 F (36.7 C), temperature source Oral, resp. rate 18, height 5' (1.524 m), weight 61.2 kg, SpO2 99 %. 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  Patient has CMC osteoarthritis about the thumb.  Both are highly symptomatic with her planned surgical reconstruction.  She is neurovascularly intact.  The patient is alert and oriented in no acute distress. The patient complains of pain in the affected upper extremity.  The patient is noted to have a normal HEENT exam. Lung fields show equal chest expansion and no shortness of  breath. Abdomen exam is nontender without distention. Lower extremity examination does not show any fracture dislocation or blood clot symptoms. Pelvis is stable and the neck and back are stable and nontender.  Assessment/Plan We will plan for surgical reconstruction left thumb and left wrist secondary to wrist arthritis, avascular necrosis of the scaphoid and CMC basilar thumb joint arthritis.  She understands all risk and benefits and desires to proceed.  The patient is alert and oriented in no acute distress. The patient complains of pain in the affected upper extremity.  The patient is noted to have a normal HEENT exam. Lung fields show equal chest expansion and no shortness of  breath. Abdomen exam is nontender without distention. Lower extremity examination does not show any fracture dislocation or blood clot symptoms. Pelvis is stable and the neck and back are stable and nontender.   Willa Frater III, MD 06/11/2022, 3:00 PM

## 2022-06-11 NOTE — Anesthesia Postprocedure Evaluation (Signed)
Anesthesia Post Note  Patient: Aireanna Luellen  Procedure(s) Performed: Left wrist proximal row carpectomy with extensor pollicis longus transfer and posterior interosseous nerve neurectomy. Left thumb basilar joint arthroplasty with tendon transfer and repair/suspension as necessary. (Left)     Patient location during evaluation: PACU Anesthesia Type: Regional Level of consciousness: awake and alert, patient cooperative and oriented Pain management: pain level controlled Vital Signs Assessment: post-procedure vital signs reviewed and stable Respiratory status: spontaneous breathing, nonlabored ventilation and respiratory function stable Cardiovascular status: blood pressure returned to baseline and stable Postop Assessment: no apparent nausea or vomiting and able to ambulate Anesthetic complications: no   No notable events documented.  Last Vitals:  Vitals:   06/11/22 2000 06/11/22 2005  BP: (!) 152/78 (!) 151/75  Pulse: 70 74  Resp: 12 12  Temp:    SpO2: 98% 100%    Last Pain:  Vitals:   06/11/22 2005  TempSrc:   PainSc: 0-No pain                 Khristy Kalan,E. Kealey Kemmer

## 2022-06-12 ENCOUNTER — Other Ambulatory Visit: Payer: Self-pay

## 2022-06-13 ENCOUNTER — Encounter (HOSPITAL_COMMUNITY): Payer: Self-pay | Admitting: Orthopedic Surgery

## 2023-01-22 ENCOUNTER — Other Ambulatory Visit: Payer: Self-pay | Admitting: Family Medicine

## 2023-01-22 DIAGNOSIS — Z1231 Encounter for screening mammogram for malignant neoplasm of breast: Secondary | ICD-10-CM

## 2023-02-26 ENCOUNTER — Ambulatory Visit (INDEPENDENT_AMBULATORY_CARE_PROVIDER_SITE_OTHER): Payer: Medicare Other

## 2023-02-26 DIAGNOSIS — Z1231 Encounter for screening mammogram for malignant neoplasm of breast: Secondary | ICD-10-CM

## 2023-04-02 IMAGING — MG MM DIGITAL SCREENING BILAT W/ TOMO AND CAD
8 series · 8 of 24 positions shown · non-contrast
Comparison: Previous exam(s).

CLINICAL DATA: Screening.

EXAM:
DIGITAL SCREENING BILATERAL MAMMOGRAM WITH TOMOSYNTHESIS AND CAD
TECHNIQUE: Bilateral screening digital craniocaudal and mediolateral oblique
mammograms were obtained. Bilateral screening digital breast
tomosynthesis was performed. The images were evaluated with
computer-aided detection.

[L CC synth-2D]
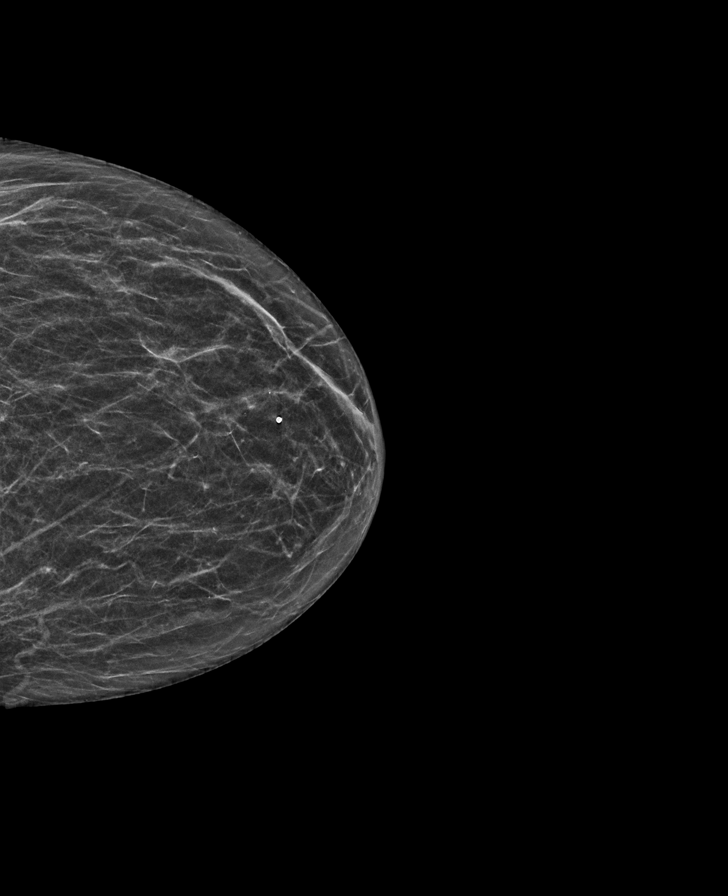

[R CC synth-2D]
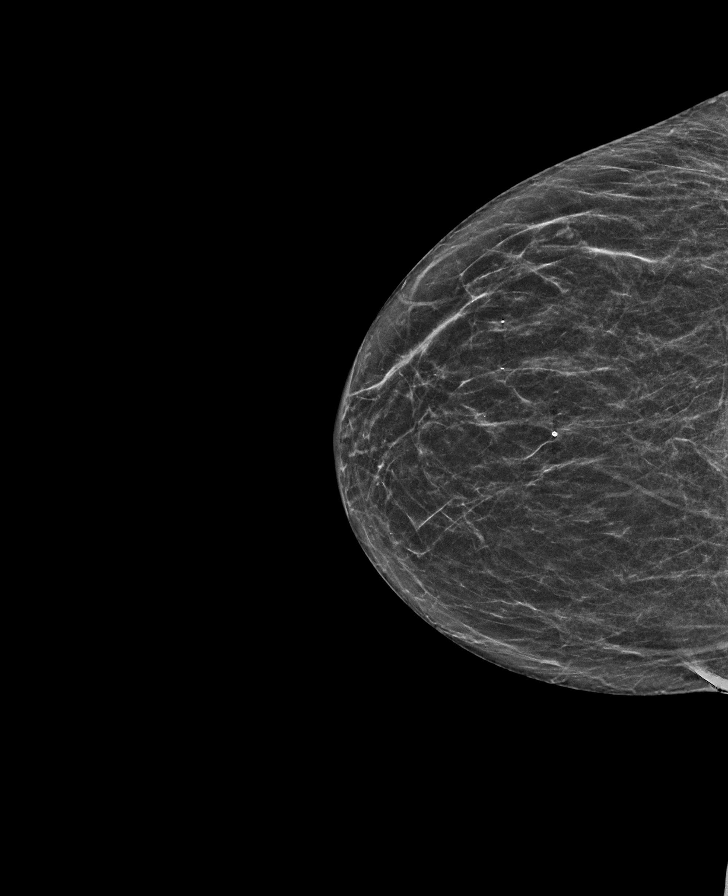

[L MLO synth-2D]
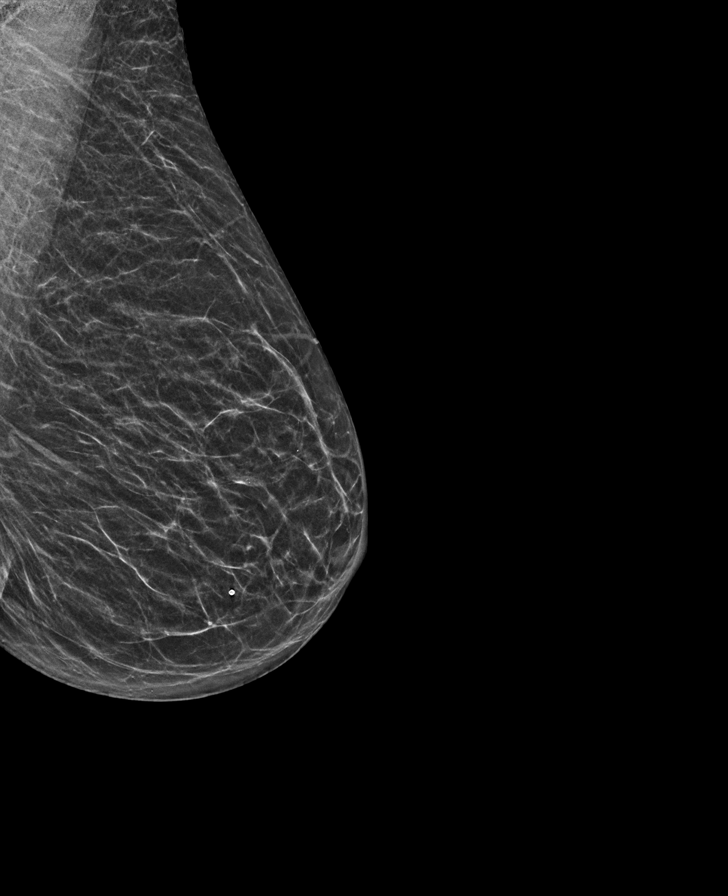

[R MLO synth-2D]
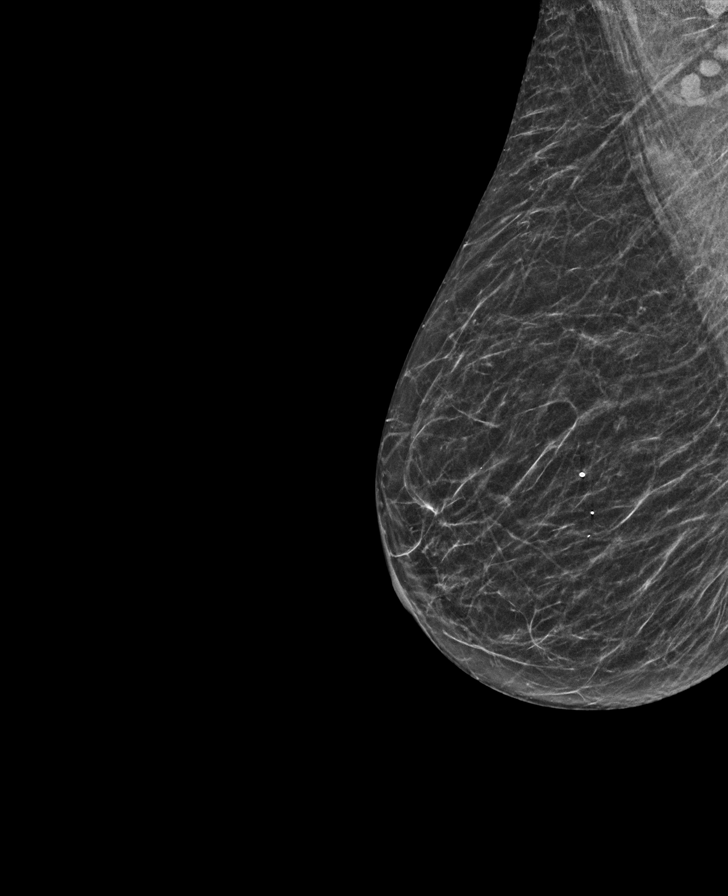

[R MLO tomo · tomo slice 25/48.0]
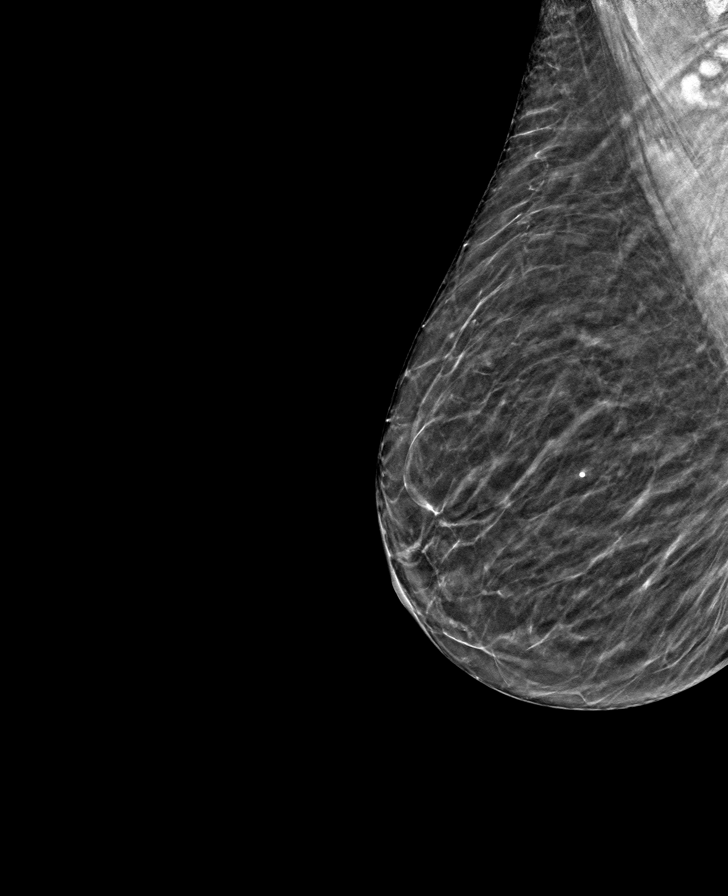

[L MLO tomo · tomo slice 23/45.0]
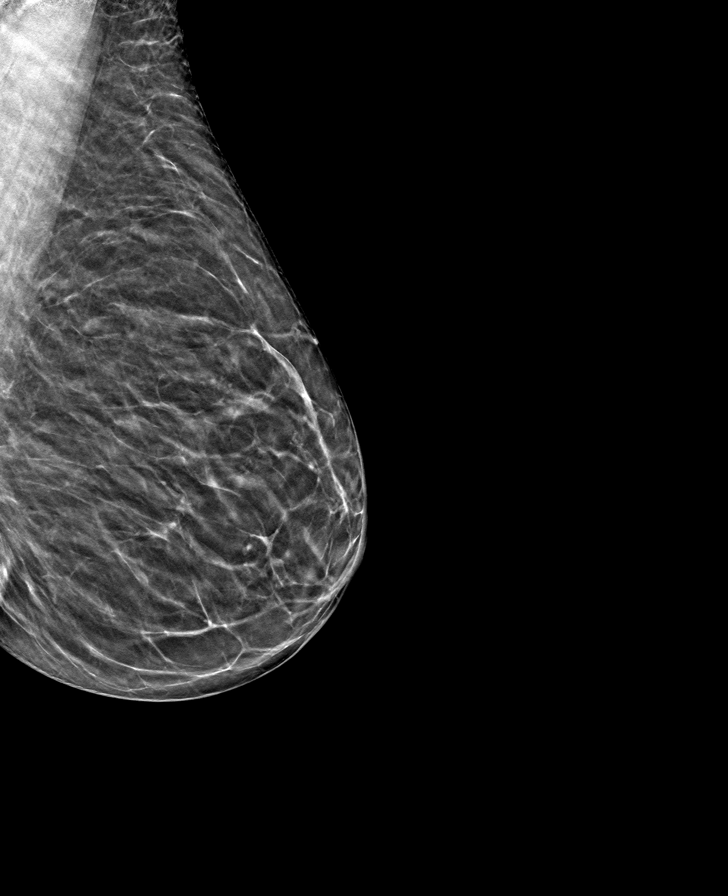

[L CC tomo · tomo slice 23/45.0]
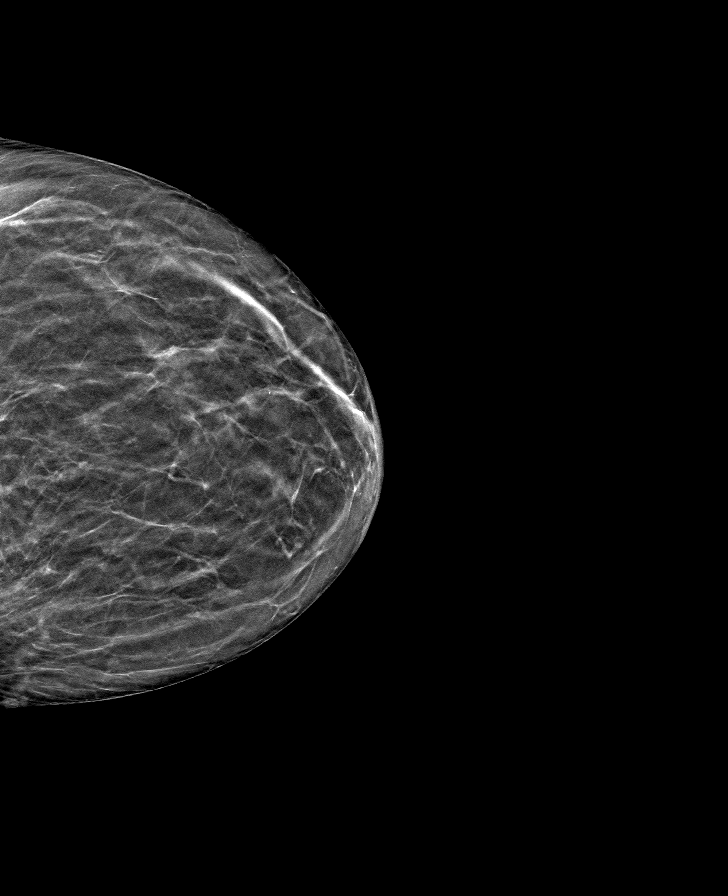

[R CC tomo · tomo slice 24/47.0]
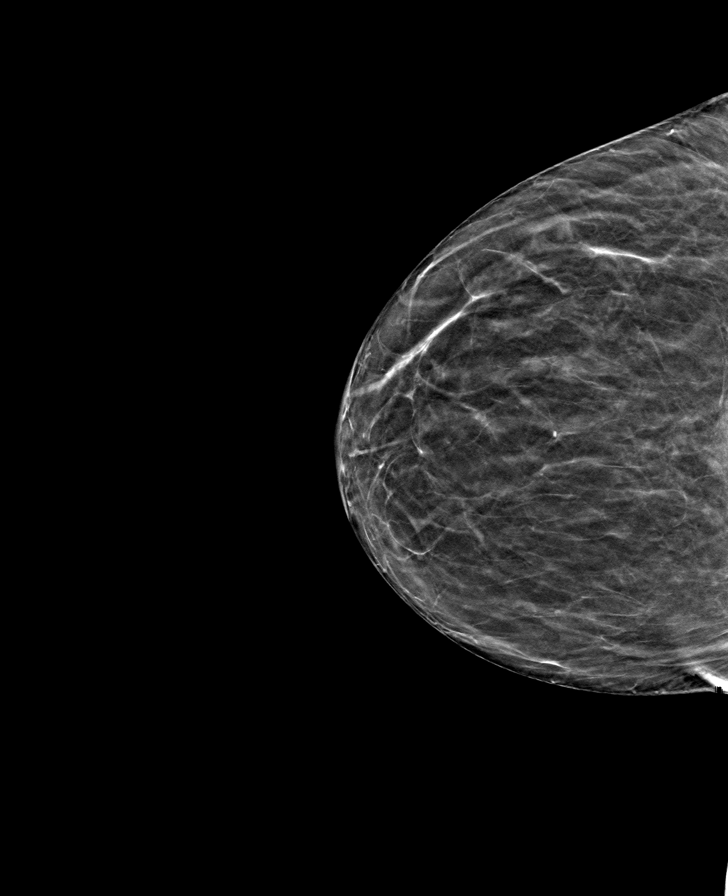

[8 of 24 positions shown; findings below may reference images not displayed]

ACR Breast Density Category b: There are scattered areas of
fibroglandular density.
FINDINGS: There are no findings suspicious for malignancy.
IMPRESSION: No mammographic evidence of malignancy. A result letter of this
screening mammogram will be mailed directly to the patient.

RECOMMENDATION:
Screening mammogram in one year. (Code:51-O-LD2)

BI-RADS CATEGORY  1: Negative.

## 2024-01-16 ENCOUNTER — Other Ambulatory Visit: Payer: Self-pay | Admitting: Family Medicine

## 2024-01-16 DIAGNOSIS — Z1231 Encounter for screening mammogram for malignant neoplasm of breast: Secondary | ICD-10-CM

## 2024-03-04 ENCOUNTER — Ambulatory Visit (INDEPENDENT_AMBULATORY_CARE_PROVIDER_SITE_OTHER)

## 2024-03-04 DIAGNOSIS — Z1231 Encounter for screening mammogram for malignant neoplasm of breast: Secondary | ICD-10-CM
